# Patient Record
Sex: Female | Born: 1970 | Race: White | Hispanic: No | Marital: Married | Smoking: Never smoker
Health system: Southern US, Community
[De-identification: ages and names within clinical notes are randomized; demographics above are authoritative.]

## PROBLEM LIST (undated history)

## (undated) DIAGNOSIS — E663 Overweight: Secondary | ICD-10-CM

## (undated) DIAGNOSIS — D72829 Elevated white blood cell count, unspecified: Secondary | ICD-10-CM

## (undated) DIAGNOSIS — E782 Mixed hyperlipidemia: Secondary | ICD-10-CM

## (undated) DIAGNOSIS — J029 Acute pharyngitis, unspecified: Secondary | ICD-10-CM

## (undated) DIAGNOSIS — G43909 Migraine, unspecified, not intractable, without status migrainosus: Secondary | ICD-10-CM

## (undated) DIAGNOSIS — F329 Major depressive disorder, single episode, unspecified: Secondary | ICD-10-CM

## (undated) DIAGNOSIS — G44209 Tension-type headache, unspecified, not intractable: Secondary | ICD-10-CM

## (undated) DIAGNOSIS — K219 Gastro-esophageal reflux disease without esophagitis: Secondary | ICD-10-CM

## (undated) DIAGNOSIS — R000 Tachycardia, unspecified: Secondary | ICD-10-CM

## (undated) DIAGNOSIS — G44009 Cluster headache syndrome, unspecified, not intractable: Secondary | ICD-10-CM

## (undated) DIAGNOSIS — Z0000 Encounter for general adult medical examination without abnormal findings: Secondary | ICD-10-CM

## (undated) DIAGNOSIS — L259 Unspecified contact dermatitis, unspecified cause: Secondary | ICD-10-CM

## (undated) HISTORY — DX: Tension-type headache, unspecified, not intractable: G44.209

## (undated) HISTORY — DX: Unspecified contact dermatitis, unspecified cause: L25.9

## (undated) HISTORY — DX: Major depressive disorder, single episode, unspecified: F32.9

## (undated) HISTORY — PX: GASTRIC FUNDOPLICATION: SHX226

## (undated) HISTORY — PX: ARTHROSCOPIC REPAIR ACL: SUR80

## (undated) HISTORY — DX: Tachycardia, unspecified: R00.0

## (undated) HISTORY — DX: Cluster headache syndrome, unspecified, not intractable: G44.009

## (undated) HISTORY — DX: Elevated white blood cell count, unspecified: D72.829

## (undated) HISTORY — DX: Gastro-esophageal reflux disease without esophagitis: K21.9

## (undated) HISTORY — DX: Migraine, unspecified, not intractable, without status migrainosus: G43.909

## (undated) HISTORY — DX: Encounter for general adult medical examination without abnormal findings: Z00.00

## (undated) HISTORY — DX: Acute pharyngitis, unspecified: J02.9

## (undated) HISTORY — PX: OTHER SURGICAL HISTORY: SHX169

## (undated) HISTORY — DX: Overweight: E66.3

## (undated) HISTORY — DX: Mixed hyperlipidemia: E78.2

---
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
## 1999-09-23 HISTORY — PX: LIPOSUCTION: SHX10

## 2003-09-23 HISTORY — PX: OTHER SURGICAL HISTORY: SHX169

## 2008-09-22 HISTORY — PX: OTHER SURGICAL HISTORY: SHX169

## 2009-10-23 LAB — CONVERTED CEMR LAB

## 2010-05-29 ENCOUNTER — Inpatient Hospital Stay (HOSPITAL_COMMUNITY): Admission: AD | Admit: 2010-05-29 | Discharge: 2010-05-30 | Payer: Self-pay | Admitting: Obstetrics and Gynecology

## 2010-09-30 ENCOUNTER — Ambulatory Visit
Admission: RE | Admit: 2010-09-30 | Discharge: 2010-09-30 | Payer: Self-pay | Source: Home / Self Care | Attending: Family Medicine | Admitting: Family Medicine

## 2010-09-30 DIAGNOSIS — G44209 Tension-type headache, unspecified, not intractable: Secondary | ICD-10-CM | POA: Insufficient documentation

## 2010-09-30 DIAGNOSIS — E782 Mixed hyperlipidemia: Secondary | ICD-10-CM

## 2010-09-30 DIAGNOSIS — K219 Gastro-esophageal reflux disease without esophagitis: Secondary | ICD-10-CM | POA: Insufficient documentation

## 2010-09-30 DIAGNOSIS — F329 Major depressive disorder, single episode, unspecified: Secondary | ICD-10-CM | POA: Insufficient documentation

## 2010-09-30 DIAGNOSIS — G43909 Migraine, unspecified, not intractable, without status migrainosus: Secondary | ICD-10-CM | POA: Insufficient documentation

## 2010-09-30 DIAGNOSIS — F3289 Other specified depressive episodes: Secondary | ICD-10-CM | POA: Insufficient documentation

## 2010-09-30 DIAGNOSIS — E663 Overweight: Secondary | ICD-10-CM

## 2010-09-30 HISTORY — DX: Other specified depressive episodes: F32.89

## 2010-09-30 HISTORY — DX: Gastro-esophageal reflux disease without esophagitis: K21.9

## 2010-09-30 HISTORY — DX: Migraine, unspecified, not intractable, without status migrainosus: G43.909

## 2010-09-30 HISTORY — DX: Major depressive disorder, single episode, unspecified: F32.9

## 2010-09-30 HISTORY — DX: Mixed hyperlipidemia: E78.2

## 2010-09-30 HISTORY — DX: Overweight: E66.3

## 2010-09-30 HISTORY — DX: Tension-type headache, unspecified, not intractable: G44.209

## 2010-10-02 ENCOUNTER — Encounter (INDEPENDENT_AMBULATORY_CARE_PROVIDER_SITE_OTHER): Payer: Self-pay | Admitting: *Deleted

## 2010-10-07 ENCOUNTER — Ambulatory Visit (HOSPITAL_COMMUNITY)
Admission: RE | Admit: 2010-10-07 | Discharge: 2010-10-07 | Payer: Self-pay | Source: Home / Self Care | Attending: Obstetrics and Gynecology | Admitting: Obstetrics and Gynecology

## 2010-10-21 ENCOUNTER — Other Ambulatory Visit (HOSPITAL_COMMUNITY): Payer: Self-pay | Admitting: Obstetrics and Gynecology

## 2010-10-21 ENCOUNTER — Ambulatory Visit (HOSPITAL_COMMUNITY): Admission: RE | Admit: 2010-10-21 | Payer: Self-pay | Source: Home / Self Care | Admitting: Obstetrics and Gynecology

## 2010-10-21 DIAGNOSIS — N971 Female infertility of tubal origin: Secondary | ICD-10-CM

## 2010-10-24 ENCOUNTER — Encounter: Payer: Self-pay | Admitting: Family Medicine

## 2010-10-24 NOTE — Letter (Signed)
Summary: New Patient letter  [facility]  [address]   [place], [place] [address]   Phone: [phone]  Fax: [phone]       10/02/2010 MRN: [id number]  [facility] [address] [place], [place]  [address]  Dear Ms. Autumn Jensen,  Welcome to the Gastroenterology Division at [facility].    You are scheduled to see Dr.  Jarold Motto on 11/07/2010 at 9:30am on the 3rd floor at [facility], [address]  We ask that you try to arrive at our office 15 minutes prior to your appointment time to allow for check-in.  We would like you to complete the enclosed self-administered evaluation form prior to your visit and bring it with you on the day of your appointment.  We will review it with you.  Also, please bring a complete list of all your medications or, if you prefer, bring the medication bottles and we will list them.  Please bring your insurance card so that we may make a copy of it.  If your insurance requires a referral to see a specialist, please bring your referral form from your primary care physician.  Co-payments are due at the time of your visit and may be paid by cash, check or credit card.     Your office visit will consist of a consult with your physician (includes a physical exam), any laboratory testing he/she may order, scheduling of any necessary diagnostic testing (e.g. x-ray, ultrasound, CT-scan), and scheduling of a procedure (e.g. Endoscopy, Colonoscopy) if required.  Please allow enough time on your schedule to allow for any/all of these possibilities.    If you cannot keep your appointment, please call [phone] to cancel or reschedule prior to your appointment date.  This allows [place] the opportunity to schedule an appointment for another patient in need of care.  If you do not cancel or reschedule by 5 p.m. the business day prior to your appointment date, you will be charged a $50.00 late cancellation/no-show fee.    Thank you for  choosing [facility] for your medical needs.  We appreciate the opportunity to care for you.  Please visit [place] at our website  to learn more about our practice.                     Sincerely,                                                             The Gastroenterology Division

## 2010-10-24 NOTE — Assessment & Plan Note (Signed)
Summary: New pt BC[employer]/dt   Vital Signs:  Patient profile:   40 year old female Menstrual status:  regular Height:      66.75 inches (169.55 cm) Weight:      175.50 pounds (79.77 kg) BMI:     27.79 O2 Sat:      97 % on Room air Temp:     98.2 degrees F (36.78 degrees C) oral Pulse rate:   86 / minute BP sitting:   145 / 87  (right arm) Cuff size:   regular  Vitals Entered By: Josph Macho RMA (September 30, 2010 9:48 AM)  O2 Flow:  Room air CC: Establish new patient/ CF Is Patient Diabetic? No     Menstrual Status regular Last PAP Result historical   History of Present Illness: Patient is a 40 yo Caucasian female in today for new patient appt. 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 She is generally in good health but is struggling with increased heartburn recently as well as some low grade depression. She is asking about the uses St. John's wort but has not had anything to eat. She is acknowledging she gets poor sleep, has very little time to herself and is struggling with poor memory and concentration. Her mood is low and some mild irritability is noted at times. No suicidal or homicidal ideation. She has started volunteering at [employer] and is able to get out to run errands once a week without the kids as well. This helps some. She is married and her husband has been very supportive. Her other concerns are a history of infrequent Migraine HAs and some pain over her neck/occiput and sinuses with tension/alcohol or caffeine consumption. She does not take any meds for these and they eventually resolve spontaneously. She has a long h/o reflux and actually underwent a Fundoplication which controlled her symptoms until her second pregnancy and she is now back on Prilosec. She has reflux symptoms roughly once a week, does not correlate it with any dietary changes. no recent illness, f, c, CP, palp, SOB, GI or GU c/o.  Preventive Screening-Counseling &  Management  Alcohol-Tobacco     Smoking Status: never  Caffeine-Diet-Exercise     Does Patient Exercise: yes      Drug Use:  no.    Current Medications (verified): 1)  Prilosec 20 Mg Cpdr (Omeprazole) .... Once Daily  Allergies (verified): No Known Drug Allergies  Past History:  Past Medical History: GERD  Past Surgical History: tympanostomy tubes x 1 at age 42yo right knee repair and cartilage repair in 2010 Left Knee repair 1988, arthroscopic Liposuction thighs, 2001 Nissan fundoplication for GERD in 2005  Family History: Father: 87, hyperlipidemia, Prostate Cancer, Reflux Mother: 60, hyperlipidemia Siblings:  Sister: 36, Reflux, congenital heart disease, 3 holes in her heart, brain damage Brother: 40, joint disease s/p hip replacement, gout, cig & alcohol MGM: deceased@73 , brain aneurysm, chronic HA MGF: deceased@92 , old age PGM: deceased@78 , lung cancer, smoker, alcohol PGF: deceased younger car accident, abnormal curvature of spine Daughter: 60yo, A&W Son: 2yo, A&W Son: 4 mn, A&W PUncle: deceased@50 , gastric cancer, smoked and drank PUncle: deceased@42 , Head and Neck Cancer in sinuses  Social History: Occupation: stay at home Mom, previously worked English as a second language teacher and [employer] Married Never Smoked Alcohol use-yes, rare Drug use-no Regular exercise-yes, restarted Seat belt use No dietary restrictions Smoking Status:  never Occupation:  employed Drug Use:  no Does Patient Exercise:  yes  Review of Systems  The patient denies anorexia, fever, weight loss, weight gain, vision loss, decreased hearing, hoarseness, chest pain, syncope, dyspnea on exertion, peripheral edema, prolonged cough, headaches, hemoptysis, abdominal pain, melena, hematochezia, severe indigestion/heartburn, hematuria, incontinence, muscle weakness, suspicious skin lesions, transient blindness, difficulty walking, depression, unusual weight change, abnormal bleeding, and enlarged  lymph nodes.    Physical Exam  General:  Well-developed,well-nourished,in no acute distress; alert,appropriate and cooperative throughout examination Head:  Normocephalic and atraumatic without obvious abnormalities. No apparent alopecia or balding. Eyes:  No corneal or conjunctival inflammation noted. EOMI. Perrla.  Ears:  External ear exam shows no significant lesions or deformities.  Otoscopic examination reveals clear canals, tympanic membranes are intact bilaterally without bulging, retraction, inflammation or discharge. Hearing is grossly normal bilaterally. Nose:  External nasal examination shows no deformity or inflammation. Nasal mucosa are pink and moist without lesions or exudates. Mouth:  Oral mucosa and oropharynx without lesions or exudates.  Teeth in good repair. Neck:  No masses, or tenderness noted. Exaggerated curvature of cervical spine noted. Lungs:  Normal respiratory effort, chest expands symmetrically. Lungs are clear to auscultation, no crackles or wheezes. Heart:  Normal rate and regular rhythm. S1 and S2 normal without gallop, murmur, click, rub or other extra sounds. Abdomen:  Bowel sounds positive,abdomen soft and non-tender without masses, organomegaly or hernias noted. Msk:  No deformity or scoliosis noted of thoracic or lumbar spine.   Pulses:  R and L carotid,radial,femoral,dorsalis pedis and posterior tibial pulses are full and equal bilaterally Extremities:  No clubbing, cyanosis, edema, or deformity noted with normal full range of motion of all joints.   Neurologic:  No cranial nerve deficits noted. Station and gait are normal. Plantar reflexes are down-going bilaterally. DTRs are symmetrical throughout. Sensory, motor and coordinative functions appear intact. Skin:  Intact without suspicious lesions or rashes Cervical Nodes:  No lymphadenopathy noted Psych:  Cognition and judgment appear intact. Alert and cooperative with normal attention span and  concentration. No apparent delusions, illusions, hallucinations   Impression & Recommendations:  Problem # 1:  MIXED HYPERLIPIDEMIA (ICD-272.2) Agrees to FLP at next visit, avoid trans fats, increase exercise and continue fish oil. Consider adding a fiber supplement such as Benefiber or Metamucil daily.  Problem # 2:  GERD (ICD-530.81)  Her updated medication list for this problem includes:    Prilosec 20 Mg Cpdr (Omeprazole) .Marland Kitchen... 1 cap po once daily  Orders: Gastroenterology Referral (GI) Avoid offending foods, may use Tums or Rolaids for  breakthrough symptoms which presently happen roughly once a week.  Problem # 3:  HEADACHE, TENSION (ICD-307.81) Neck has increased curvature, check an xray at next visit. Recommend alternate heat and ice and consider Aspercreme as needed to neck and occiput. Attempt gentle stretching after heat  Problem # 4:  OVERWEIGHT (ICD-278.02) Avoid trans fats, increase exercise and sleep and use complex carbs and lean proteins  Problem # 5:  DEPRESSION (ICD-311) Patient is considering St John's Wort, recommend if she tries this we check a renal and hepatic panel roughly 2 months after starting. Call us if she wants to consider medication.  Complete Medication List: 1)  Prilosec 20 Mg Cpdr (Omeprazole) .Marland Kitchen.. 1 cap po once daily 2)  Omega-3 & Omega-6 Fish Oil Caps (Omega 3-6-9 fatty acids) .... 2 caps daily  Patient Instructions: 1)  Please schedule a follow-up appointment in 2 to 3 months.  2)  Will need fasting labs at or prior to  that visit: V70.0 3)  FLP, hepatic, renal, CBC, TSH 4)  Will check xray of neck at that visit 5)  Consider a Tdap at next visit 6)  Avoid spicy and fatty foods together and use Tums and/or Rolaids as needed for breakthrough heartburn symptoms Prescriptions: PRILOSEC 20 MG CPDR (OMEPRAZOLE) 1 cap po once daily  #90 x 3   Entered and Authorized by:   Danise Edge MD   Signed by:   Danise Edge MD on 09/30/2010   Method used:    Electronically to        [facility]  [address] [phone]* (retail)       [address] [place], [place]  [address]       Ph: [phone] or [phone]       Fax: [phone]   RxID:   [id number]    Orders Added: 1)  Gastroenterology Referral [GI] 2)  New Patient Level IV [[address]644]   Immunization History:  Influenza Immunization History:    Influenza:  historical (05/23/2010)   Immunization History:  Influenza Immunization History:    Influenza:  Historical (05/23/2010)  Preventive Care Screening  Pap Smear:    Date:  10/23/2009    Results:  historical   Last Tetanus Booster:    Date:  09/23/1999    Results:  Historical

## 2010-10-26 ENCOUNTER — Encounter: Payer: Self-pay | Admitting: Obstetrics and Gynecology

## 2010-11-07 ENCOUNTER — Encounter (INDEPENDENT_AMBULATORY_CARE_PROVIDER_SITE_OTHER): Payer: Self-pay | Admitting: *Deleted

## 2010-11-07 ENCOUNTER — Encounter: Payer: Self-pay | Admitting: Gastroenterology

## 2010-11-07 ENCOUNTER — Ambulatory Visit (INDEPENDENT_AMBULATORY_CARE_PROVIDER_SITE_OTHER): Payer: BC Managed Care – PPO | Admitting: Gastroenterology

## 2010-11-07 DIAGNOSIS — K219 Gastro-esophageal reflux disease without esophagitis: Secondary | ICD-10-CM

## 2010-11-13 NOTE — Assessment & Plan Note (Addendum)
Summary: ACID REFLUX  JJ.  SCH'D W/VANESSA @ [facility]//BCBS//POLIC...   History of Present Illness Visit Type: Initial Consult Primary GI MD: Sheryn Bison MD FACP FAGA Primary Provider: Danise Edge, MD Requesting Provider: Danise Edge, MD Chief Complaint: GERD wants to establish GI   last GI  visit was 2005 in [place]  History of Present Illness:   Extremely Pleasant 40 year old white female with 3 young children, youngest 46 months of age. She is referred through the courtesy of Dr. Reuel Derby for evaluation recurrent acid reflux symptoms despite previous fundoplication surgery in 2005 in [place].  Azyriah has had acid reflux for greater than 10 years, underwent fundoplication 2005, did well without symptomatology until 2009 when her symptoms have again recurred with regurgitation and burning substernal chest pain. She's been back on Prilosec taken randomly in the afternoon with fairly good control of her symptoms. She denies dysphagia or painful swallowing, frequent antibiotic use, anorexia, weight loss, lower GI or hepatobiliary complaints. She denies abuse of alcohol, cigarettes, or NSAIDs. Family history is remarkable for several family members with GERD, and her sister has Barrett's mucosa.   GI Review of Systems    Reports acid reflux and  heartburn.      Denies abdominal pain, belching, bloating, chest pain, dysphagia with liquids, dysphagia with solids, loss of appetite, nausea, vomiting, vomiting blood, weight loss, and  weight gain.        Denies anal fissure, black tarry stools, change in bowel habit, constipation, diarrhea, diverticulosis, fecal incontinence, heme positive stool, hemorrhoids, irritable bowel syndrome, jaundice, light color stool, liver problems, rectal bleeding, and  rectal pain.    Current Medications (verified): 1)  Prilosec 20 Mg Cpdr (Omeprazole) .Marland Kitchen.. 1 Cap Po Once Daily 2)  Omega-3 & Omega-6 Fish Oil  Caps (Omega 3-6-9 Fatty Acids) .... 2 Caps  Daily 3)  Multivitamins  Tabs (Multiple Vitamin) .Marland Kitchen.. 1 By Mouth Once Daily  Allergies (verified): No Known Drug Allergies  Past History:  Past medical, surgical, family and social histories (including risk factors) reviewed for relevance to current acute and chronic problems.  Past Medical History: Reviewed history from 09/30/2010 and no changes required. GERD  Past Surgical History: Reviewed history from 09/30/2010 and no changes required. tympanostomy tubes x 1 at age 49yo right knee repair and cartilage repair in 2010 Left Knee repair 1988, arthroscopic Liposuction thighs, 2001 Nissan fundoplication for GERD in 2005  Family History: Reviewed history from 09/30/2010 and no changes required. Father: 28, hyperlipidemia, Prostate Cancer, Reflux Mother: 45, hyperlipidemia Siblings:  Sister: 42, Reflux, congenital heart disease, 3 holes in her heart, brain damage Brother: 4, joint disease s/p hip replacement, gout, cig & alcohol MGM: deceased@73 , brain aneurysm, chronic HA MGF: deceased@92 , old age PGM: deceased@78 , lung cancer, smoker, alcohol PGF: deceased younger car accident, abnormal curvature of spine Daughter: 62yo, A&W Son: 2yo, A&W Son: 4 mn, A&W PUncle: deceased@50 , gastric cancer, smoked and drank PUncle: deceased@42 , Head and Neck Cancer in sinuses  Social History: Reviewed history from 09/30/2010 and no changes required. Occupation: stay at home Mom, previously worked [employer] and [employer] Married Never Smoked Alcohol use-yes, rare Drug use-no Regular exercise-yes, restarted Seat belt use No dietary restrictions  Review of Systems       The patient complains of headaches-new.  The patient denies allergy/sinus, anemia, anxiety-new, arthritis/joint pain, back pain, blood in urine, breast changes/lumps, change in vision, confusion, cough, coughing up blood, depression-new, fainting, fatigue, fever, hearing problems, heart murmur, heart  rhythm  changes, itching, menstrual pain, muscle pains/cramps, night sweats, nosebleeds, pregnancy symptoms, shortness of breath, skin rash, sleeping problems, sore throat, swelling of feet/legs, swollen lymph glands, thirst - excessive , urination - excessive , urination changes/pain, urine leakage, vision changes, and voice change.    Vital Signs:  Patient profile:   40 year old female Menstrual status:  regular Height:      66.75 inches Weight:      175.6 pounds BMI:     27.81 Pulse rate:   80 / minute Pulse rhythm:   regular BP sitting:   126 / 80  (left arm)  Vitals Entered By: Harlow Mares CMA Duncan Dull) (November 07, 2010 9:44 AM)  Physical Exam  General:  Well developed, well nourished, no acute distress.healthy appearing.   Head:  Normocephalic and atraumatic. Eyes:  PERRLA, no icterus.exam deferred to patient's ophthalmologist.   Mouth:  No deformity or lesions, dentition normal. Neck:  Supple; no masses or thyromegaly. Chest Wall:  Symmetrical;  no deformities or tenderness. Lungs:  Clear throughout to auscultation. Heart:  Regular rate and rhythm; no murmurs, rubs,  or bruits. Abdomen:  Soft, nontender and nondistended. No masses, hepatosplenomegaly or hernias noted. Normal bowel sounds. Msk:  Symmetrical with no gross deformities. Normal posture. Extremities:  No clubbing, cyanosis, edema or deformities noted. Neurologic:  Alert and  oriented x4;  grossly normal neurologically. Cervical Nodes:  No significant cervical adenopathy. Psych:  Alert and cooperative. Normal mood and affect.   Impression & Recommendations:  Problem # 1:  GERD (ICD-530.81) Assessment Improved Continue Prilosec 20 mg 30 minutes before the first meal of the day with standard antireflux maneuvers. Endoscopic exam scheduled for assessment of the severity of her problem and to rule out Barrett's mucosa per her family history. Obviously, her fundoplication has slipped, but she does not desire repeat surgery  at this time. Orders: EGD (EGD)  Problem # 2:  OVERWEIGHT (ICD-278.02) Assessment: Improved BMI is 27 and the patient certainly is not obese.  Problem # 3:  DEPRESSION (ICD-311) Assessment: Improved  Patient Instructions: 1)  Copy sent to : Danise Edge, MD 2)  Your procedure has been scheduled for 12/09/2010, please follow the seperate instructions.  3)  [facility] Patient Information Guide given to patient.  4)  Upper Endoscopy brochure given.  5)  Avoid foods high in acid content ( tomatoes, citrus juices, spicy foods) . Avoid eating within 3 to 4 hours of lying down or before exercising. Do not over eat; try smaller more frequent meals. Elevate head of bed four inches when sleeping.  6)  GI Reflux brochure given.  7)  The medication list was reviewed and reconciled.  All changed / newly prescribed medications were explained.  A complete medication list was provided to the patient / caregiver.

## 2010-11-13 NOTE — Letter (Signed)
Summary: EGD Instructions  [facility]  [address] [place], [place] [address]   Phone: [phone]  Fax: [phone]       Autumn Jensen    04-Oct-1970    MRN: [id number]       Procedure Day /Date: Monday 12/09/2010     Arrival Time:  1:00pm     Procedure Time: 2:00pm     Location of Procedure:                    [facility] (4th Floor)   PREPARATION FOR ENDOSCOPY   On 12/09/2010 THE DAY OF THE PROCEDURE:  1.   No solid foods, milk or milk products are allowed after midnight the night before your procedure.  2.   Do not drink anything colored red or purple.  Avoid juices with pulp.  No orange juice.  3.  You may drink clear liquids until 12:00pm, which is 2 hours before your procedure.                                                                                                CLEAR LIQUIDS INCLUDE: Water Jello Ice Popsicles Tea (sugar ok, no milk/cream) Powdered fruit flavored drinks Coffee (sugar ok, no milk/cream) Gatorade Juice: apple, white grape, white cranberry  Lemonade Clear bullion, consomm, broth Carbonated beverages (any kind) Strained chicken noodle soup Hard Candy   MEDICATION INSTRUCTIONS  Unless otherwise instructed, you should take regular prescription medications with a small sip of water as early as possible the morning of your procedure.                OTHER INSTRUCTIONS  You will need a responsible adult at least 39 years of age to accompany you and drive you home.   This person must remain in the waiting room during your procedure.  Wear loose fitting clothing that is easily removed.  Leave jewelry and other valuables at home.  However, you may wish to bring a book to read or an iPod/MP3 player to listen to music as you wait for your procedure to start.  Remove all body piercing jewelry and leave at home.  Total time from sign-in until discharge is approximately 2-3 hours.  You should go  home directly after your procedure and rest.  You can resume normal activities the day after your procedure.  The day of your procedure you should not:   Drive   Make legal decisions   Operate machinery   Drink alcohol   Return to work  You will receive specific instructions about eating, activities and medications before you leave.    The above instructions have been reviewed and explained to me by   _______________________    I fully understand and can verbalize these instructions _____________________________ Date _________

## 2010-11-15 ENCOUNTER — Other Ambulatory Visit (HOSPITAL_COMMUNITY): Payer: BC Managed Care – PPO

## 2010-11-25 ENCOUNTER — Encounter: Payer: Self-pay | Admitting: Family Medicine

## 2010-11-25 ENCOUNTER — Ambulatory Visit (INDEPENDENT_AMBULATORY_CARE_PROVIDER_SITE_OTHER): Payer: BC Managed Care – PPO | Admitting: Family Medicine

## 2010-11-25 DIAGNOSIS — K219 Gastro-esophageal reflux disease without esophagitis: Secondary | ICD-10-CM

## 2010-11-25 DIAGNOSIS — J020 Streptococcal pharyngitis: Secondary | ICD-10-CM

## 2010-11-25 DIAGNOSIS — G44209 Tension-type headache, unspecified, not intractable: Secondary | ICD-10-CM

## 2010-11-25 DIAGNOSIS — E663 Overweight: Secondary | ICD-10-CM

## 2010-11-25 DIAGNOSIS — E782 Mixed hyperlipidemia: Secondary | ICD-10-CM

## 2010-11-25 DIAGNOSIS — G43909 Migraine, unspecified, not intractable, without status migrainosus: Secondary | ICD-10-CM

## 2010-11-25 LAB — CONVERTED CEMR LAB: Rapid Strep: POSITIVE

## 2010-11-26 ENCOUNTER — Other Ambulatory Visit (HOSPITAL_COMMUNITY): Payer: Self-pay | Admitting: Obstetrics and Gynecology

## 2010-11-26 DIAGNOSIS — N971 Female infertility of tubal origin: Secondary | ICD-10-CM

## 2010-11-29 ENCOUNTER — Encounter: Payer: Self-pay | Admitting: Family Medicine

## 2010-12-02 ENCOUNTER — Ambulatory Visit (HOSPITAL_COMMUNITY): Admission: RE | Admit: 2010-12-02 | Payer: BC Managed Care – PPO | Source: Ambulatory Visit

## 2010-12-02 ENCOUNTER — Encounter: Payer: Self-pay | Admitting: Family Medicine

## 2010-12-03 NOTE — Assessment & Plan Note (Signed)
Summary: ? strep throat   Vital Signs:  Patient profile:   40 year old female Menstrual status:  regular Height:      66.75 inches (169.55 cm) Weight:      175 pounds (79.55 kg) O2 Sat:      97 % on Room air Temp:     9.0 degrees F (-12.78 degrees C) oral Pulse rate:   92 / minute BP sitting:   140 / 82  (right arm) Cuff size:   regular  Vitals Entered By: Josph Macho RMA (November 25, 2010 2:36 PM)  O2 Flow:  Room air CC: Possible strep throat, sore throat w/ white spots on back of throat/ CF Is Patient Diabetic? No   History of Present Illness: Patient is a 40 yo Caucasian female in today for evaluation of sore throat. She has a distant history of strep pharyngitis. She is a couple of weeks ago she developed fevers, chills, headache, sore throat. she reports her throat was red, swollen and had red spots. She has a family member who is a physician they called in penicillin t.i.d. for 10 days for her and her symptoms improved markedly but she finished her antibiotic week ago and in the last 2 days her symptoms began to recur. She is presently having malaise, sore and red throat but so far no fevers or chills are returned although she is noting some nausea and mild anorexia. She is having headaches but doesn't an ongoing problem. She does have a history of both tension and migraine headaches. The migraines with the nausea visual changes, photophobia and phonophobia or generally infrequent but have become somewhat more frequent. Of note she has several small children her youngest is 67 months of age she is getting very poor sleep is eating poorly he is not exercising and is under great stress. She noticed this is playing some role. She does not hydrate well often skips meals and generally has trouble taking care of herself at the present time. She continues to struggle with some heartburn in the right malar she does feel the Prilosec helps to a degree. She is denying any chest pain,  palpitations, sour taste in her throat, shortness of breath.  Current Medications (verified): 1)  Prilosec 20 Mg Cpdr (Omeprazole) .Marland Kitchen.. 1 Cap Po Once Daily 2)  Omega-3 & Omega-6 Fish Oil  Caps (Omega 3-6-9 Fatty Acids) .... 2 Caps Daily 3)  Multivitamins  Tabs (Multiple Vitamin) .Marland Kitchen.. 1 By Mouth Once Daily  Allergies (verified): No Known Drug Allergies  Past History:  Past medical history reviewed for relevance to current acute and chronic problems. Social history (including risk factors) reviewed for relevance to current acute and chronic problems.  Past Medical History: Reviewed history from 09/30/2010 and no changes required. GERD  Social History: Reviewed history from 09/30/2010 and no changes required. Occupation: stay at home Mom, previously worked [employer] and [employer] Married Never Smoked Alcohol use-yes, rare Drug use-no Regular exercise-yes, restarted Seat belt use No dietary restrictions  Review of Systems      See HPI  Physical Exam  General:  Well-developed,well-nourished,in no acute distress; alert,appropriate and cooperative throughout examination Head:  Normocephalic and atraumatic without obvious abnormalities. No apparent alopecia or balding. Ears:  External ear exam shows no significant lesions or deformities.  Otoscopic examination reveals clear canals, tympanic membranes are intact bilaterally without bulging, retraction, inflammation or discharge. Hearing is grossly normal bilaterally. Nose:  External nasal examination shows no deformity or inflammation. Nasal  mucosa are pink and moist without lesions or exudates. Mouth:  Oral mucosa without lesions or exudates.  Teeth in good repair.pharyngeal erythema and mild edema, tonsils not particularly hypertrophied Neck:  No deformities, masses, or tenderness noted. Lungs:  Normal respiratory effort, chest expands symmetrically. Lungs are clear to auscultation, no crackles or wheezes. Heart:  Normal  rate and regular rhythm. S1 and S2 normal without gallop, murmur, click, rub or other extra sounds. Abdomen:  Bowel sounds positive,abdomen soft and non-tender without masses, organomegaly or hernias noted. Extremities:  No clubbing, cyanosis, edema, or deformity noted with normal full range of motion of all joints.   Cervical Nodes:  R and L  anterior LN tender and mildly enlarged Psych:  Cognition and judgment appear intact. Alert and cooperative with normal attention span and concentration. No apparent delusions, illusions, hallucinations   Impression & Recommendations:  Problem # 1:  PHARYNGITIS, STREPTOCOCCAL (ICD-034.0)  Her updated medication list for this problem includes:    Cefdinir 300 Mg Caps (Cefdinir) .Marland Kitchen... 1 cap by mouth two times a day x 10 day, recently treated with PCN and got notably better but since off of antibiotics symptoms are returning and rapid strep is positive, will retreat today and patient encouraged to start a probiotic cap and eat a yogurt daily. Call if worsening or persistent symptoms  Problem # 2:  OVERWEIGHT (ICD-278.02) Patient very frustrated regarding her weight and fatigue. She is the mother of several young children, getting very little sleep and unable to take care of herself well at the moment but she is encouraged to try for 7-8 hours of sleep nightly, increase her activity, eat small, frequent meals and avoid simple carbs and trans fats, will repeat lab work to evaluate thyroid as well and anemia  Problem # 3:  MIXED HYPERLIPIDEMIA (ICD-272.2)  Will repeat FLP prior to next visit and avoid trans fats in the interim  Problem # 4:  MIGRAINE HEADACHE (ICD-346.90)  Her updated medication list for this problem includes:    Excedrin Migraine 250-250-65 Mg Tabs (Aspirin-acetaminophen-caffeine) .Marland Kitchen... 1 tab by mouth as needed migraine Encouraged good hydration, no skipping meals or over loading on carbs, avoid any obvious food triggers and attempt regular  exercise and 8 hours of sleep nightly, if no improvement has had good results with Imitrex in the past may consider restarting. May also consider a CCB if frequency of HA remains hi despite changes  Problem # 5:  GERD (ICD-530.81)  Her updated medication list for this problem includes:    Prilosec 20 Mg Cpdr (Omeprazole) .Marland Kitchen... 1 cap po once daily Avoid offending foods and eat small, frequent meals  Complete Medication List: 1)  Prilosec 20 Mg Cpdr (Omeprazole) .Marland Kitchen.. 1 cap po once daily 2)  Omega-3 & Omega-6 Fish Oil Caps (Omega 3-6-9 fatty acids) .... 2 caps daily 3)  Multivitamins Tabs (Multiple vitamin) .Marland Kitchen.. 1 by mouth once daily 4)  Cefdinir 300 Mg Caps (Cefdinir) .Marland Kitchen.. 1 cap by mouth two times a day x 10 day 5)  Excedrin Migraine 250-250-65 Mg Tabs (Aspirin-acetaminophen-caffeine) .Marland Kitchen.. 1 tab by mouth as needed migraine  Other Orders: Rapid Strep (29528)  Patient Instructions: 1)  Please schedule a follow-up appointment in 1 month.  2)  Needs annual labs prior to visit, FLP, liver, renal, TSH, CBC just prior to visit Prescriptions: CEFDINIR 300 MG CAPS (CEFDINIR) 1 cap by mouth two times a day x 10 day  #20 x 0   Entered and Authorized by:  Danise Edge MD   Signed by:   Danise Edge MD on 11/25/2010   Method used:   Electronically to        [facility]  [address] [phone]* (retail)       [address] [place], [place]  [address]       Ph: [phone] or [phone]       Fax: [phone]   RxID:   [id number]    Orders Added: 1)  Rapid Strep [36644] 2)  Est. Patient Level IV [03474]    Laboratory Results    Other Tests  Rapid Strep: positive

## 2010-12-05 LAB — CBC
HCT: 35.6 % — ABNORMAL LOW (ref 36.0–46.0)
HCT: 39.8 % (ref 36.0–46.0)
Hemoglobin: 13.6 g/dL (ref 12.0–15.0)
MCHC: 34.2 g/dL (ref 30.0–36.0)
MCV: 90.5 fL (ref 78.0–100.0)
RDW: 14.8 % (ref 11.5–15.5)
WBC: 19 10*3/uL — ABNORMAL HIGH (ref 4.0–10.5)

## 2010-12-05 LAB — RH IMMUNE GLOB WKUP(>/=20WKS)(NOT WOMEN'S HOSP)

## 2010-12-09 ENCOUNTER — Other Ambulatory Visit: Payer: BC Managed Care – PPO | Admitting: Gastroenterology

## 2010-12-16 ENCOUNTER — Encounter: Payer: Self-pay | Admitting: Gastroenterology

## 2010-12-16 ENCOUNTER — Telehealth: Payer: Self-pay | Admitting: *Deleted

## 2010-12-16 ENCOUNTER — Ambulatory Visit (AMBULATORY_SURGERY_CENTER): Payer: BC Managed Care – PPO | Admitting: Gastroenterology

## 2010-12-16 VITALS — BP 133/83 | HR 71 | Temp 96.7°F | Resp 16 | Ht 67.0 in | Wt 170.0 lb

## 2010-12-16 DIAGNOSIS — R933 Abnormal findings on diagnostic imaging of other parts of digestive tract: Secondary | ICD-10-CM

## 2010-12-16 DIAGNOSIS — R6889 Other general symptoms and signs: Secondary | ICD-10-CM

## 2010-12-16 DIAGNOSIS — K219 Gastro-esophageal reflux disease without esophagitis: Secondary | ICD-10-CM

## 2010-12-16 DIAGNOSIS — K2980 Duodenitis without bleeding: Secondary | ICD-10-CM

## 2010-12-16 DIAGNOSIS — R6881 Early satiety: Secondary | ICD-10-CM

## 2010-12-16 DIAGNOSIS — K3184 Gastroparesis: Secondary | ICD-10-CM

## 2010-12-16 DIAGNOSIS — D1330 Benign neoplasm of unspecified part of small intestine: Secondary | ICD-10-CM

## 2010-12-16 DIAGNOSIS — K2270 Barrett's esophagus without dysplasia: Secondary | ICD-10-CM

## 2010-12-16 MED ORDER — AMBULATORY NON FORMULARY MEDICATION: Status: DC

## 2010-12-16 NOTE — Telephone Encounter (Signed)
Domperidone sent after LEC procedure.

## 2010-12-16 NOTE — Patient Instructions (Signed)
Discharged instructions given and verbalizes understanding. Biopsies taken and a soft diet given.

## 2010-12-17 ENCOUNTER — Other Ambulatory Visit (HOSPITAL_COMMUNITY): Payer: Self-pay | Admitting: Obstetrics and Gynecology

## 2010-12-17 ENCOUNTER — Telehealth: Payer: Self-pay | Admitting: *Deleted

## 2010-12-17 DIAGNOSIS — R6889 Other general symptoms and signs: Secondary | ICD-10-CM

## 2010-12-17 DIAGNOSIS — N971 Female infertility of tubal origin: Secondary | ICD-10-CM

## 2010-12-17 LAB — HELICOBACTER PYLORI SCREEN-BIOPSY: UREASE: NEGATIVE

## 2010-12-17 NOTE — Telephone Encounter (Signed)

## 2010-12-18 ENCOUNTER — Ambulatory Visit (HOSPITAL_COMMUNITY)
Admission: RE | Admit: 2010-12-18 | Discharge: 2010-12-18 | Disposition: A | Discharge status: Home / Self Care | Payer: BC Managed Care – PPO | Source: Ambulatory Visit | Attending: Obstetrics and Gynecology | Admitting: Obstetrics and Gynecology

## 2010-12-18 DIAGNOSIS — Z3049 Encounter for surveillance of other contraceptives: Secondary | ICD-10-CM | POA: Insufficient documentation

## 2010-12-18 DIAGNOSIS — N971 Female infertility of tubal origin: Secondary | ICD-10-CM

## 2010-12-20 ENCOUNTER — Encounter: Payer: Self-pay | Admitting: Gastroenterology

## 2010-12-20 NOTE — Progress Notes (Signed)
Patient already rxed for Domperidone.

## 2010-12-24 NOTE — Procedures (Signed)
Summary: Upper Endoscopy  Patient: [name] Note: All result statuses are Final unless otherwise noted.  Tests: (1) Upper Endoscopy (EGD)   EGD Upper Endoscopy       DONE     [facility]     [address]     [place], [place]  [address]          ENDOSCOPY PROCEDURE REPORT          PATIENT:  Autumn Jensen, Autumn Jensen  MR#:  [id number]     BIRTHDATE:  09-16-1971, 39 yrs. old  GENDER:  female          ENDOSCOPIST:  Vania Rea. Jarold Motto, MD, [facility]     Referred by:          PROCEDURE DATE:  12/16/2010     PROCEDURE:  EGD with biopsy for H. pylori 43329     ASA CLASS:  Class II     INDICATIONS:  GERD, early satiety hx of prior fundoplication for     GERD          MEDICATIONS:   Fentanyl 50 mcg IV, Versed 8 mg IV     TOPICAL ANESTHETIC:  Exactacain Spray          DESCRIPTION OF PROCEDURE:   After the risks benefits and     alternatives of the procedure were thoroughly explained, informed     consent was obtained.  The LB GIF-H180 [id number] endoscope was     introduced through the mouth and advanced to the second portion of     the duodenum, without limitations.  The instrument was slowly     withdrawn as the mucosa was fully examined.     <<PROCEDUREIMAGES>>          There were columnar-type mucosal changes in the distal esophagus,     that could represent Barrett's esophagus. GE JUNCTION BIOPSIED,,,     Duodenitis was found. SI BX,. AND CLO TESTS DONE.  Retained food     was present. LARGE VOLUME OF FOOD.COULD NOT SEE FUNDUS     AREA.PYLORIC CHANNEL WIDELY PATENT.    Retroflexed views revealed     retained contents.    The scope was then withdrawn from the     patient and the procedure completed.          COMPLICATIONS:  None          ENDOSCOPIC IMPRESSION:     1) Barrett's, possible     2) Duodenitis     3) Food, retained     4) Retained contents     1.R/O BARRETT'S MUCOSA     2.SEVERE GASTROPARESIS     3.R/O CELIAC DISEASE,H.PYLORI     4.HX OF PRIOR  FUNDOPLICATION     RECOMMENDATIONS:     1) Await biopsy results     2) Rx CLO if positive     3) continue PPI     CONSIDER DOMPERIDOME TRIAL.          REPEAT EXAM:  No          ______________________________     Vania Rea. Jarold Motto, MD, [facility]          CC:          n.     eSIGNED:   Vania Rea. Patterson at 12/16/2010 11:57 AM          Kizzie Fantasia, [id number]  Note: An exclamation mark (!) indicates a result that was not dispersed into the  flowsheet. Document Creation Date: 12/16/2010 11:57 AM _______________________________________________________________________  (1) Order result status: Final Collection or observation date-time: 12/16/2010 11:47 Requested date-time:  Receipt date-time:  Reported date-time:  Referring Physician:   Ordering Physician: Sheryn Bison [phone]) Specimen Source:  Source: Launa Grill Order Number: [id number] Lab site:

## 2011-01-01 ENCOUNTER — Other Ambulatory Visit (INDEPENDENT_AMBULATORY_CARE_PROVIDER_SITE_OTHER): Payer: BC Managed Care – PPO

## 2011-01-01 DIAGNOSIS — Z0000 Encounter for general adult medical examination without abnormal findings: Secondary | ICD-10-CM

## 2011-01-01 LAB — LIPID PANEL
Cholesterol: 236 mg/dL — ABNORMAL HIGH (ref 0–200)
HDL: 59.4 mg/dL
Total CHOL/HDL Ratio: 4
Triglycerides: 97 mg/dL (ref 0.0–149.0)
VLDL: 19.4 mg/dL (ref 0.0–40.0)

## 2011-01-01 LAB — HEPATIC FUNCTION PANEL
ALT: 17 U/L (ref 0–35)
AST: 18 U/L (ref 0–37)
Albumin: 3.7 g/dL (ref 3.5–5.2)
Alkaline Phosphatase: 62 U/L (ref 39–117)
Bilirubin, Direct: 0.1 mg/dL (ref 0.0–0.3)
Total Bilirubin: 0.9 mg/dL (ref 0.3–1.2)
Total Protein: 6.7 g/dL (ref 6.0–8.3)

## 2011-01-01 LAB — RENAL FUNCTION PANEL
Albumin: 3.7 g/dL (ref 3.5–5.2)
Calcium: 9.1 mg/dL (ref 8.4–10.5)
Creatinine, Ser: 1 mg/dL (ref 0.4–1.2)
Glucose, Bld: 78 mg/dL (ref 70–99)
Sodium: 140 mEq/L (ref 135–145)

## 2011-01-02 LAB — CBC
HCT: 44.8 % (ref 36.0–46.0)
MCHC: 32.8 g/dL (ref 30.0–36.0)
MCV: 88.9 fL (ref 78.0–100.0)
RDW: 12.4 % (ref 11.5–15.5)

## 2011-01-06 ENCOUNTER — Encounter: Payer: Self-pay | Admitting: Family Medicine

## 2011-01-06 ENCOUNTER — Ambulatory Visit (INDEPENDENT_AMBULATORY_CARE_PROVIDER_SITE_OTHER): Payer: BC Managed Care – PPO | Admitting: Family Medicine

## 2011-01-06 DIAGNOSIS — F329 Major depressive disorder, single episode, unspecified: Secondary | ICD-10-CM

## 2011-01-06 DIAGNOSIS — E663 Overweight: Secondary | ICD-10-CM

## 2011-01-06 DIAGNOSIS — G43909 Migraine, unspecified, not intractable, without status migrainosus: Secondary | ICD-10-CM

## 2011-01-06 DIAGNOSIS — E782 Mixed hyperlipidemia: Secondary | ICD-10-CM

## 2011-01-06 DIAGNOSIS — G44209 Tension-type headache, unspecified, not intractable: Secondary | ICD-10-CM

## 2011-01-06 DIAGNOSIS — K219 Gastro-esophageal reflux disease without esophagitis: Secondary | ICD-10-CM

## 2011-01-06 DIAGNOSIS — F3289 Other specified depressive episodes: Secondary | ICD-10-CM

## 2011-01-06 MED ORDER — ATENOLOL 25 MG PO TABS: 25.0000 mg | ORAL_TABLET | Freq: Every day | ORAL | Status: DC

## 2011-01-06 NOTE — Patient Instructions (Signed)
Migraine Headache   A migraine headache is an intense, throbbing pain on one or both sides of your head. The exact cause of a migraine headache is not always known. A migraine may be caused when nerves in the brain become irritated and release chemicals that cause swelling (inflammation) within blood vessels, causing pain. Many migraine sufferers have a family history of migraines. Before you get a migraine you may or may not get an aura. An aura is a group of symptoms that can predict the beginning of a migraine. An aura may include:   Visual changes such as:   Flashing lights.   Seeing bright spots or zig-zag lines.   Tunnel vision.   Feelings of numbness.   Trouble talking.   Muscle weakness.   SYMPTOMS OF A MIGRAINE   A migraine headache has one or more of the following symptoms:   Pain on one or both sides of your head.   Pain that is pulsating or throbbing in nature.   Pain that is severe enough to prevent daily activities.   Pain that is aggravated by any daily physical activity.   Nausea (feeling sick to your stomach), vomiting or both.   Pain with exposure to bright lights, loud noises or activity.   General sensitivity to bright lights or loud noises.   MIGRAINE TRIGGERS   A migraine headache can be triggered by many things. Examples of triggers include:   Alcohol.   Smoking.   Stress.   It may be related to menses (female menstruation).   Aged cheeses.   Foods or drinks that contain nitrates, glutamate, aspartame or tyramine.   Lack of sleep.   Chocolate.   Caffeine.   Hunger.   Medications such as nitroglycerine (used to treat chest pain), birth control pills, estrogen and some blood pressure medications.   DIAGNOSIS   A migraine headache is often diagnosed based on:   Your symptoms.   Physical examination.   A CT scan of your head may be ordered to see if your headaches are caused from other medical conditions.   HOME CARE INSTRUCTIONS   Medications can help prevent migraines if they are recurrent or  should they become recurrent. Your caregiver can help you with a medication or treatment program that will be helpful to you.   If you get a migraine, it may be helpful to lie down in a dark, quiet room.   It may be helpful to keep a headache diary. This may help you find a trend as to what may be triggering your headaches.   SEEK IMMEDIATE MEDICAL CARE IF:   You do not get relief from the medications given to you or you have a recurrence of pain.   You have confusion, personality changes or seizures.   You have headaches that wake you from sleep.   You have an increased frequency in your headaches.   You have a stiff neck.   You have a loss of vision.   You have muscle weakness.   You start losing your balance or have trouble walking.   You feel faint or pass out.   MAKE SURE YOU:   Understand these instructions.   Will watch your condition.   Will get help right away if you are not doing well or get worse.   Document Released: 09/08/2005 Document Re-Released: 07/06/2009   [employer]® Patient Information ©2011 [employer].

## 2011-01-10 ENCOUNTER — Encounter: Payer: Self-pay | Admitting: Family Medicine

## 2011-01-10 NOTE — Progress Notes (Signed)
Autumn Jensen [id number] [birth year] 01/10/2011      Progress Note-Follow Up  Subjective  Chief Complaint  Chief Complaint  Patient presents with  . Follow-up    go over labs   HPI  Patient is a 40 year old Caucasian female in today for followup on multiple medical problems. She reports her throat is significantly improved. She has recently undergone upper endoscopy and GI workup. At that visit she was told she had gastroparesis duodenitis as well as retained contents of the stomach, she reports being asymptomatic on her Omeprazole. She denies any reflux at this time and does not want to take any further medications. She believes she had retained contents because she had a very large meal the night before with meat ribs. Unless she develops further symptoms she would like to just maintain the omeprazole. She denies chest pain, fevers, chills, palpitations, shortness of breath, GI or GU complaints at this time. She continues to have ongoing stress being the moment and children but otherwise feels she is doing well.  She is having 2-3 tension HA weekly often starting at the occiput and responding to OTC meds and rest, has infrequent less than monthly migraines.  Past Medical History  Diagnosis Date  . GERD (gastroesophageal reflux disease)   . Headaches, cluster   . Overweight 09/30/2010  . Mixed hyperlipidemia 09/30/2010  . MIGRAINE HEADACHE 09/30/2010  . HEADACHE, TENSION 09/30/2010  . GERD 09/30/2010  . DEPRESSION 09/30/2010    Past Surgical History  Procedure Date  . Typanostomy tubes 40 yr old  . Right knee repair and cartilage repair 2010  . Left knee repair 1988    arthroscopic  . Liposuction 2001    thighs  . Nissan fundoplication for gerd 2005  . Arthroscopic repair acl     also had rt knee athroplasty with cartlidge removal  . Gastric fundoplication     Family History  Problem Relation Age of Onset  . Hyperlipidemia Mother   . Cancer Father     prostate  . Hyperlipidemia  Father   . Heart disease Sister     congenital  . Cancer Paternal Uncle     gastric  . Esophageal cancer Paternal Uncle   . Migraines Maternal Grandmother     chronic headaches  . Aneurysm Maternal Grandmother     brain  . Cancer Paternal Grandmother     lung  . Cancer Paternal Uncle     head and neck cancer in sinuses    History   Social History  . Marital Status: Married    Spouse Name: N/A    Number of Children: N/A  . Years of Education: N/A   Occupational History  . Not on file.   Social History Main Topics  . Smoking status: Never Smoker   . Smokeless tobacco: Never Used  . Alcohol Use: 0.6 oz/week    1 Glasses of wine, 0 Drinks containing 0.5 oz of alcohol per week     rare  . Drug Use: No  . Sexually Active: Yes   Other Topics Concern  . Not on file   Social History Narrative  . No narrative on file    Current Outpatient Prescriptions on File Prior to Visit  Medication Sig Dispense Refill  . aspirin-acetaminophen-caffeine (EXCEDRIN MIGRAINE) 250-250-65 MG per tablet Take 1 tablet by mouth as needed.        . multivitamin (THERAGRAN) per tablet Take 1 tablet by mouth daily.       Marland Kitchen  Omega 3-6-9 Fatty Acids (OMEGA-3 & OMEGA-6 FISH OIL PO) Take by mouth daily. 2 capsules       . omeprazole (PRILOSEC) 20 MG capsule Take 20 mg by mouth daily.        . AMBULATORY NON FORMULARY MEDICATION Domperidone 10mg   Take one tablet before meals and at bedtime.  240 tablet  3  . DISCONTD: cefdinir (OMNICEF) 300 MG capsule Take 300 mg by mouth 2 (two) times daily. X 10 days         No Known Allergies  Review of Systems  Review of Systems  Constitutional: Negative for fever and malaise/fatigue.  HENT: Negative for congestion and sore throat.   Eyes: Negative for discharge.  Respiratory: Negative for cough, shortness of breath and wheezing.   Cardiovascular: Negative for chest pain, palpitations and leg swelling.  Gastrointestinal: Negative for nausea, vomiting,  abdominal pain, diarrhea, blood in stool and melena.  Genitourinary: Positive for frequency. Negative for dysuria.  Musculoskeletal: Positive for myalgias. Negative for falls.  Skin: Negative for rash.  Neurological: Negative for loss of consciousness and headaches.  Endo/Heme/Allergies: Negative for polydipsia.  Psychiatric/Behavioral: Negative for depression and suicidal ideas. The patient is not nervous/anxious and does not have insomnia.     Objective  BP 132/82  Pulse 80  Temp(Src) 97.9 F (36.6 C) (Oral)  Ht 5\' 7"  (1.702 m)  Wt 171 lb 6.4 oz (77.747 kg)  BMI 26.85 kg/m2  SpO2 98%  LMP 12/09/2010  Physical Exam  Physical Exam  Constitutional: She is oriented to person, place, and time and well-developed, well-nourished, and in no distress. No distress.  HENT:  Head: Normocephalic and atraumatic.  Eyes: Conjunctivae are normal.  Neck: Neck supple. No thyromegaly present.  Cardiovascular: Normal rate, regular rhythm and normal heart sounds.   No murmur heard. Pulmonary/Chest: Effort normal and breath sounds normal. She has no wheezes.  Abdominal: She exhibits no distension and no mass.  Musculoskeletal: She exhibits no edema.  Lymphadenopathy:    She has no cervical adenopathy.  Neurological: She is alert and oriented to person, place, and time.  Skin: Skin is warm and dry. No rash noted. She is not diaphoretic.  Psychiatric: Memory, affect and judgment normal.    Lab Results  Component Value Date   TSH 1.33 01/01/2011   Lab Results  Component Value Date   WBC 8.2 01/01/2011   HGB 14.7 01/01/2011   HCT 44.8 01/01/2011   MCV 88.9 01/01/2011   PLT 294 01/01/2011   Lab Results  Component Value Date   CREATININE 1.0 01/01/2011   BUN 16 01/01/2011   NA 140 01/01/2011   K 4.1 01/01/2011   CL 104 01/01/2011   CO2 29 01/01/2011   Lab Results  Component Value Date   ALT 17 01/01/2011   AST 18 01/01/2011   ALKPHOS 62 01/01/2011   BILITOT 0.9 01/01/2011   Lab Results    Component Value Date   CHOL 236* 01/01/2011   Lab Results  Component Value Date   HDL 59.40 01/01/2011   No results found for this basename: LDLCALC   Lab Results  Component Value Date   TRIG 97.0 01/01/2011   Lab Results  Component Value Date   CHOLHDL 4 01/01/2011     Assessment & Plan  OVERWEIGHT Patient is a very busy young mother but she is encouraged to try and get as much exercise as possible, avoid trans fats and simple carbs and minimize po intake  Mixed hyperlipidemia Recent  labs reviewed with patient. She is encouraged to avoid trans fats and simple carbs. Start fish oil and fiber supplements and increase exercise, reassess in 6-12 months  MIGRAINE HEADACHE Infrequent migraines at present. Reminded to maintain 8 hr of sleep, small frequent meals with lean proteins and complex carbs, avoid offending foods and notify us if symptoms worsen  HEADACHE, TENSION Patient notes these are more frequent than her migraines and can occur several times a week, usually OTC meds and rest resolve them. They tend to start in the occiput but do not have to, Likely a stress component and consider mild dehydration. Encouraged 64 oz clear fluids, regular sleep and exercise, try moist heat and gentle stretching to neck.   GERD Recently underwent upper endoscopy and GI evaluation and was found to have gastroparesis, slow gastric emptying and duodenitis. She feels some of this is related to the large meal of ribs she ate the night before and she is asymptomatic at this time on the Omeprazole. She has chosen to cont the Omeprazole but not to proceed with further meds. She is encouraged to eat small, frequent meals with small portions of fats and proteins and not to eat too close to bedtime. Consider further treatments if symptoms worsen  DEPRESSION At present she feels she is doing well and she is encouraged to try and carve out a little time for herself. She will notify us if she needs further  assistance

## 2011-01-10 NOTE — Assessment & Plan Note (Signed)
Patient notes these are more frequent than her migraines and can occur several times a week, usually OTC meds and rest resolve them. They tend to start in the occiput but do not have to, Likely a stress component and consider mild dehydration. Encouraged 64 oz clear fluids, regular sleep and exercise, try moist heat and gentle stretching to neck.

## 2011-01-10 NOTE — Assessment & Plan Note (Signed)
Recent labs reviewed with patient. She is encouraged to avoid trans fats and simple carbs. Start fish oil and fiber supplements and increase exercise, reassess in 6-12 months

## 2011-01-10 NOTE — Assessment & Plan Note (Signed)
Recently underwent upper endoscopy and GI evaluation and was found to have gastroparesis, slow gastric emptying and duodenitis. She feels some of this is related to the large meal of ribs she ate the night before and she is asymptomatic at this time on the Omeprazole. She has chosen to cont the Omeprazole but not to proceed with further meds. She is encouraged to eat small, frequent meals with small portions of fats and proteins and not to eat too close to bedtime. Consider further treatments if symptoms worsen

## 2011-01-10 NOTE — Assessment & Plan Note (Signed)
Infrequent migraines at present. Reminded to maintain 8 hr of sleep, small frequent meals with lean proteins and complex carbs, avoid offending foods and notify us if symptoms worsen

## 2011-01-10 NOTE — Assessment & Plan Note (Signed)
At present she feels she is doing well and she is encouraged to try and carve out a little time for herself. She will notify us if she needs further assistance

## 2011-01-10 NOTE — Assessment & Plan Note (Signed)
Patient is a very busy young mother but she is encouraged to try and get as much exercise as possible, avoid trans fats and simple carbs and minimize po intake

## 2011-02-11 ENCOUNTER — Encounter: Payer: Self-pay | Admitting: Family Medicine

## 2011-02-11 ENCOUNTER — Ambulatory Visit (INDEPENDENT_AMBULATORY_CARE_PROVIDER_SITE_OTHER): Payer: BC Managed Care – PPO | Admitting: Family Medicine

## 2011-02-11 VITALS — BP 144/86 | HR 94 | Temp 99.6°F | Ht 66.75 in | Wt 166.1 lb

## 2011-02-11 DIAGNOSIS — J029 Acute pharyngitis, unspecified: Secondary | ICD-10-CM

## 2011-02-11 DIAGNOSIS — L309 Dermatitis, unspecified: Secondary | ICD-10-CM

## 2011-02-11 DIAGNOSIS — R000 Tachycardia, unspecified: Secondary | ICD-10-CM

## 2011-02-11 DIAGNOSIS — L259 Unspecified contact dermatitis, unspecified cause: Secondary | ICD-10-CM

## 2011-02-11 HISTORY — DX: Tachycardia, unspecified: R00.0

## 2011-02-11 HISTORY — DX: Acute pharyngitis, unspecified: J02.9

## 2011-02-11 HISTORY — DX: Unspecified contact dermatitis, unspecified cause: L25.9

## 2011-02-11 MED ORDER — AMOXICILLIN-POT CLAVULANATE 875-125 MG PO TABS: 1.0000 | ORAL_TABLET | Freq: Two times a day (BID) | ORAL | Status: AC

## 2011-02-11 MED ORDER — TRIAMCINOLONE ACETONIDE 0.1 % EX OINT: TOPICAL_OINTMENT | Freq: Two times a day (BID) | CUTANEOUS | Status: AC

## 2011-02-11 NOTE — Assessment & Plan Note (Signed)
The patient is in today with 5 days of worsening sore throat. Has a long history of both staph and strep. Rapid strep is positive today in the office. Will treat with Augmentin 875 and she is asked to eat a yogurt and take a probiotic cap such as Align daily for the next couple of months to try and decrease number of infections moving forward

## 2011-02-11 NOTE — Assessment & Plan Note (Signed)
In today with two young children and not feeling well. Improved on recheck will continue to monitor

## 2011-02-11 NOTE — Patient Instructions (Signed)
Contact Dermatitis Contact dermatitis is a reaction to certain substances that touch the skin. The substances may irritate the skin or cause an allergic reaction. Many substances can cause contact dermatitis. Common causes are cosmetics, jewelry, soaps, solvents and any number of chemicals. The area of skin that is exposed becomes dry, red, cracked, and itchy, and looks like a rash. In severe cases, blisters may develop. Symptoms (problems) can be controlled with treatment and by avoiding substances that caused the reaction. TREATMENT AND PREVENTION MEASURES  Keep the area of skin that is affected away from hot water, soap, sunlight, chemicals, acidic substances, or anything else that would irritate your skin. Do not rub the skin.   Use medications as directed.   You may use:   A topical steroid to reduce inflammation (redness or soreness) and anti-bacterial (germ) ointments for secondary infections.   Lubricants to keep moisture in your skin.   Burrow's solution to reduce inflammation or dry rash if weeping. Mix one packet or tablet in 2 cups cool water. Dip a clean washcloth in the mixture, wring it out a bit, and put it on the affected area. Leave in place for 30 minutes, then re-soak the washcloth and apply again. Do this as often as possible throughout the day.   If the area is too large to cover with a wash cloth, take several cornstarch or baking soda baths daily.   You may want to rest affected areas until less sore.  SEEK IMMEDIATE MEDICAL CARE IF:  An oral temperature above 104 develops.   You see signs of infection, such as swelling, tenderness, inflammation (redness or soreness), or warmth of affected area.   Treatment does not relieve your symptoms within 2 days, or as suggested by your caregiver.   You have any problems related to medication used.  Document Released: 09/05/2000 Document Re-Released: 02/26/2010 [facility] Patient Information 2011 [place], [place].Pharyngitis  (Viral and Bacterial) Pharyngitis is soreness (inflammation) or infection of the pharynx. It is also called a sore throat. CAUSES Most sore throats are caused by viruses and are part of a cold. However, some sore throats are caused by strep and other bacteria. Sore throats can also be caused by post nasal drip from draining sinuses, allergies and sometimes from sleeping with an open mouth. Infectious sore throats can be spread from person to person by coughing, sneezing and sharing cups or eating utensils. TREATMENT Sore throats that are viral usually last 3-4 days. Viral illness will get better without medications (antibiotics). Strep throat and other bacterial infections will usually begin to get better about 24-48 hours after you begin to take antibiotics. HOME CARE INSTRUCTIONS  If the caregiver feels there is a bacterial infection or if there is a positive strep test, they will prescribe an antibiotic. The full course of antibiotics must be taken!! If the full course of antibiotic is not taken, you or your child may become ill again. If you or your child has strep throat and do not finish all of the medication, serious heart or kidney diseases may develop.   Drink enough water and fluids to keep your urine clear or pale yellow.   Only take over-the-counter or prescription medicines for pain, discomfort or fever as directed by your caregiver.   Get lots of rest.   Gargle with salt water ( tsp. of salt in a glass of water) as often as every 1-2 hours as you need for comfort.   Hard candies may soothe the throat if individual is  not at risk for choking. Throat sprays or lozenges may also be used.  SEEK MEDICAL CARE IF:  Large, tender lumps in the neck develop.   A rash develops.   Green, yellow-brown or bloody sputum is coughed up.   You or your child has an oral temperature above 102 F (38.9 C).   Your baby is older than 3 months with a rectal temperature of 100.5 F (38.1 C) or  higher for more than 1 day.  SEEK IMMEDIATE MEDICAL CARE IF:  A stiff neck develops.   You or your child are drooling or unable to swallow liquids.   You or your child are vomiting, unable to keep medications or liquids down.   You or your child has severe pain, unrelieved with recommended medications.   You or your child are having difficulty breathing (not due to stuffy nose).   You or your child are unable to fully open your mouth.   You or your child develop redness, swelling, or severe pain anywhere on the neck.   You or your child has an oral temperature above 102 F (38.9 C), not controlled by medicine.   Your baby is older than 3 months with a rectal temperature of 102 F (38.9 C) or higher.   Your baby is 35 months old or younger with a rectal temperature of 100.4 F (38 C) or higher.  MAKE SURE YOU:   Understand these instructions.   Will watch your condition.   Will get help right away if you are not doing well or get worse.  Document Released: 09/08/2005 Document Re-Released: 02/26/2010 [facility] Patient Information 2011 [place], [place].

## 2011-02-11 NOTE — Progress Notes (Signed)
Autumn Jensen [id number] [birth year] 02/11/2011      Progress Note-Follow Up  Subjective  Chief Complaint  Chief Complaint  Patient presents with  . Sore Throat    possible strep X 2 days- throat burned last week    HPI  Patient is a 40 year old Caucasian female who is in today with 5 days of worsening sore throat. She describes initially a sensation of burning which has now progressed to a feeling as if her throat is swelling. Today she began with some low-grade malaise and myalgias as well. She denies headache, congestion, cough and any obvious fevers or chills. She denies any GI symptoms such as anorexia or nausea. She denies chest pain, palpitations, shortness of breath. 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 She is here today with her 2 young children and is planning to leave on a camping trip in a few days time. She has not tried any over-the-counter medications for these symptoms thus far. She is also complaining of a rash on her right upper extremity which is been present a couple of days, is linear. This got some mild pruritus and she does believe that poison ivy although she is unclear where she got  Past Medical History  Diagnosis Date  . GERD (gastroesophageal reflux disease)   . Headaches, cluster   . Overweight 09/30/2010  . Mixed hyperlipidemia 09/30/2010  . MIGRAINE HEADACHE 09/30/2010  . HEADACHE, TENSION 09/30/2010  . GERD 09/30/2010  . DEPRESSION 09/30/2010  . Contact dermatitis 02/11/2011  . Pharyngitis, acute 02/11/2011  . Tachycardia 02/11/2011    Past Surgical History  Procedure Date  . Typanostomy tubes 40 yr old  . Right knee repair and cartilage repair 2010  . Left knee repair 1988    arthroscopic  . Liposuction 2001    thighs  . Nissan fundoplication for gerd 2005  . Arthroscopic repair acl     also had rt knee athroplasty with cartlidge removal  . Gastric  fundoplication     Family History  Problem Relation Age of Onset  . Hyperlipidemia Mother   . Cancer Father     prostate  . Hyperlipidemia Father   . Heart disease Sister     congenital  . Cancer Paternal Uncle     gastric  . Esophageal cancer Paternal Uncle   . Migraines Maternal Grandmother     chronic headaches  . Aneurysm Maternal Grandmother     brain  . Cancer Paternal Grandmother     lung  . Cancer Paternal Uncle     head and neck cancer in sinuses    History   Social History  . Marital Status: Married    Spouse Name: N/A    Number of Children: N/A  . Years of Education: N/A   Occupational History  . Not on file.   Social History Main Topics  . Smoking status: Never Smoker   . Smokeless tobacco: Never Used  . Alcohol Use: 0.6 oz/week    1 Glasses of wine, 0 Drinks containing 0.5 oz of alcohol per week     rare  . Drug Use: No  . Sexually Active: Yes   Other Topics Concern  . Not on file   Social History Narrative  . No narrative on file    Current Outpatient Prescriptions on File Prior to Visit  Medication Sig Dispense Refill  . aspirin-acetaminophen-caffeine (EXCEDRIN MIGRAINE) [phone]  MG per tablet Take 1 tablet by mouth as needed.        . multivitamin (THERAGRAN) per tablet Take 1 tablet by mouth daily.       . Omega 3-6-9 Fatty Acids (OMEGA-3 & OMEGA-6 FISH OIL PO) Take by mouth daily. 2 capsules       . omeprazole (PRILOSEC) 20 MG capsule Take 20 mg by mouth daily.        . AMBULATORY NON FORMULARY MEDICATION Domperidone 10mg   Take one tablet before meals and at bedtime.  240 tablet  3  . atenolol (TENORMIN) 25 MG tablet Take 1 tablet (25 mg total) by mouth daily.  30 tablet  3  . Norethin-Eth Estradiol-Fe 0.4-35 MG-MCG CHEW         No Known Allergies  Review of Systems  Review of Systems  Constitutional: Positive for malaise/fatigue. Negative for fever.  HENT: Positive for sore throat. Negative for congestion.   Eyes: Negative  for discharge.  Respiratory: Negative for shortness of breath.   Cardiovascular: Negative for chest pain, palpitations and leg swelling.  Gastrointestinal: Negative for nausea, abdominal pain and diarrhea.  Genitourinary: Negative for dysuria.  Musculoskeletal: Positive for myalgias. Negative for falls.  Skin: Negative for rash.  Neurological: Negative for loss of consciousness and headaches.  Endo/Heme/Allergies: Negative for polydipsia.  Psychiatric/Behavioral: Negative for depression and suicidal ideas. The patient is not nervous/anxious and does not have insomnia.     Objective  BP 144/86  Pulse 94  Temp(Src) 99.6 F (37.6 C) (Oral)  Ht 5' 6.75" (1.695 m)  Wt 166 lb 1.9 oz (75.352 kg)  BMI 26.21 kg/m2  SpO2 96%  LMP 02/10/2011  Physical Exam  Physical Exam  Constitutional: She is oriented to person, place, and time and well-developed, well-nourished, and in no distress. No distress.  HENT:  Head: Normocephalic and atraumatic.  Nose: Nose normal.  Mouth/Throat: No oropharyngeal exudate.       Erythema in throat  Eyes: Conjunctivae are normal.  Neck: Neck supple. No thyromegaly present.  Cardiovascular: Normal rate, regular rhythm and normal heart sounds.   No murmur heard. Pulmonary/Chest: Effort normal and breath sounds normal. She has no wheezes.  Abdominal: She exhibits no distension and no mass.  Musculoskeletal: She exhibits no edema.  Lymphadenopathy:    She has no cervical adenopathy.  Neurological: She is alert and oriented to person, place, and time.  Skin: Skin is warm and dry. No rash noted. She is not diaphoretic.  Psychiatric: Memory, affect and judgment normal.    Lab Results  Component Value Date   TSH 1.33 01/01/2011   Lab Results  Component Value Date   WBC 8.2 01/01/2011   HGB 14.7 01/01/2011   HCT 44.8 01/01/2011   MCV 88.9 01/01/2011   PLT 294 01/01/2011   Lab Results  Component Value Date   CREATININE 1.0 01/01/2011   BUN 16 01/01/2011    NA 140 01/01/2011   K 4.1 01/01/2011   CL 104 01/01/2011   CO2 29 01/01/2011   Lab Results  Component Value Date   ALT 17 01/01/2011   AST 18 01/01/2011   ALKPHOS 62 01/01/2011   BILITOT 0.9 01/01/2011   Lab Results  Component Value Date   CHOL 236* 01/01/2011   Lab Results  Component Value Date   HDL 59.40 01/01/2011   No results found for this basename: [facility]   Lab Results  Component Value Date   TRIG 97.0 01/01/2011   Lab Results  Component Value Date   CHOLHDL 4 01/01/2011     Assessment & Plan  Pharyngitis, acute The patient is in today with 5 days of worsening sore throat. Has a long history of both staph and strep. Rapid strep is positive today in the office. Will treat with Augmentin 875 and she is asked to eat a yogurt and take a probiotic cap such as Align daily for the next couple of months to try and decrease number of infections moving forward  Contact dermatitis The patient has a linear lesion on her right upper extremity which is mildly pruritic raised. Consistent with poison ivy she's had in the past. We will give her some triamcinolone ointment 0.1% to use topically twice a day as needed and she will report if symptoms worsen. Encouraged to use witch hazel astringent when necessary for any new lesions.  Tachycardia In today with two young children and not feeling well. Improved on recheck will continue to monitor

## 2011-02-11 NOTE — Assessment & Plan Note (Signed)
The patient has a linear lesion on her right upper extremity which is mildly pruritic raised. Consistent with poison ivy she's had in the past. We will give her some triamcinolone ointment 0.1% to use topically twice a day as needed and she will report if symptoms worsen. Encouraged to use witch hazel astringent when necessary for any new lesions.

## 2011-03-05 ENCOUNTER — Ambulatory Visit: Payer: BC Managed Care – PPO | Admitting: Family Medicine

## 2011-11-21 ENCOUNTER — Encounter: Payer: Self-pay | Admitting: Family Medicine

## 2011-11-21 ENCOUNTER — Ambulatory Visit (INDEPENDENT_AMBULATORY_CARE_PROVIDER_SITE_OTHER): Payer: PRIVATE HEALTH INSURANCE | Admitting: Family Medicine

## 2011-11-21 VITALS — BP 138/84 | HR 87 | Temp 97.8°F | Resp 12 | Ht 67.0 in | Wt 163.0 lb

## 2011-11-21 DIAGNOSIS — E785 Hyperlipidemia, unspecified: Secondary | ICD-10-CM

## 2011-11-21 DIAGNOSIS — IMO0001 Reserved for inherently not codable concepts without codable children: Secondary | ICD-10-CM

## 2011-11-21 DIAGNOSIS — R5383 Other fatigue: Secondary | ICD-10-CM

## 2011-11-21 DIAGNOSIS — K219 Gastro-esophageal reflux disease without esophagitis: Secondary | ICD-10-CM

## 2011-11-21 DIAGNOSIS — R5381 Other malaise: Secondary | ICD-10-CM

## 2011-11-21 DIAGNOSIS — F329 Major depressive disorder, single episode, unspecified: Secondary | ICD-10-CM

## 2011-11-21 DIAGNOSIS — Z23 Encounter for immunization: Secondary | ICD-10-CM

## 2011-11-21 DIAGNOSIS — Z0000 Encounter for general adult medical examination without abnormal findings: Secondary | ICD-10-CM

## 2011-11-21 DIAGNOSIS — E782 Mixed hyperlipidemia: Secondary | ICD-10-CM

## 2011-11-21 DIAGNOSIS — E663 Overweight: Secondary | ICD-10-CM

## 2011-11-21 DIAGNOSIS — F3289 Other specified depressive episodes: Secondary | ICD-10-CM

## 2011-11-21 DIAGNOSIS — R000 Tachycardia, unspecified: Secondary | ICD-10-CM

## 2011-11-21 LAB — CBC
HCT: 43.7 % (ref 36.0–46.0)
Hemoglobin: 14.6 g/dL (ref 12.0–15.0)
MCHC: 33.3 g/dL (ref 30.0–36.0)
MCV: 88.2 fl (ref 78.0–100.0)
RDW: 12.8 % (ref 11.5–14.6)

## 2011-11-21 LAB — RENAL FUNCTION PANEL
Albumin: 4.2 g/dL (ref 3.5–5.2)
BUN: 16 mg/dL (ref 6–23)
Calcium: 9.2 mg/dL (ref 8.4–10.5)
Creatinine, Ser: 1 mg/dL (ref 0.4–1.2)
Glucose, Bld: 90 mg/dL (ref 70–99)

## 2011-11-21 LAB — LIPID PANEL
Cholesterol: 184 mg/dL (ref 0–200)
HDL: 55.5 mg/dL (ref >39.00)
VLDL: 11.2 mg/dL (ref 0.0–40.0)

## 2011-11-21 LAB — HEPATIC FUNCTION PANEL
ALT: 16 U/L (ref 0–35)
Total Protein: 7.3 g/dL (ref 6.0–8.3)

## 2011-11-21 LAB — TSH: TSH: 2.09 u[IU]/mL (ref 0.35–5.50)

## 2011-11-21 MED ORDER — OMEPRAZOLE 20 MG PO CPDR: 20.0000 mg | DELAYED_RELEASE_CAPSULE | Freq: Every day | ORAL | Status: DC

## 2011-11-21 NOTE — Patient Instructions (Signed)

## 2011-11-23 ENCOUNTER — Encounter: Payer: Self-pay | Admitting: Family Medicine

## 2011-11-23 DIAGNOSIS — Z0000 Encounter for general adult medical examination without abnormal findings: Secondary | ICD-10-CM

## 2011-11-23 HISTORY — DX: Encounter for general adult medical examination without abnormal findings: Z00.00

## 2011-11-23 NOTE — Progress Notes (Signed)
Patient ID: Autumn HACKWORTH, female   DOB: [birth year], 41 y.o.   MRN: [id number] Autumn Jensen [id number] Jan 04, 1971 11/23/2011      Progress Note New Patient  Subjective  Chief Complaint  Chief Complaint  Patient presents with  . Annual Exam    CPX with fasting labs, no pap    HPI  Patient is a 41 year old Caucasian female who is in today for annual exam. Overall she's doing well. She follows with GYN for her Pap smears. No recent illness, fevers, chills, chest pain, palpitations, shortness of breath, GI or GU complaints recently. She does have to take omeprazole daily for her reflux returns. She done any recent migraines. She is cleared up she is also to get them with poor sleep and alcohol consumption so she tries to avoid both these things. She also notes that occasionally occur premenstrually. She denies any chest palpitations, shortness of breath. She's had no skin concerns or allergic symptoms  Past Medical History  Diagnosis Date  . GERD (gastroesophageal reflux disease)   . Headaches, cluster   . Overweight 09/30/2010  . Mixed hyperlipidemia 09/30/2010  . MIGRAINE HEADACHE 09/30/2010  . HEADACHE, TENSION 09/30/2010  . GERD 09/30/2010  . DEPRESSION 09/30/2010  . Contact dermatitis 02/11/2011  . Pharyngitis, acute 02/11/2011  . Tachycardia 02/11/2011  . Preventative health care 11/23/2011    Past Surgical History  Procedure Date  . Typanostomy tubes 41 yr old  . Right knee repair and cartilage repair 2010  . Left knee repair 1988    arthroscopic  . Liposuction 2001    thighs  . [employer] fundoplication for gerd 2005  . Arthroscopic repair acl     also had rt knee athroplasty with cartlidge removal  . Gastric fundoplication     Family History  Problem Relation Age of Onset  . Hyperlipidemia Mother   . Cancer Father     prostate  . Hyperlipidemia Father   . Heart disease Sister     congenital  . Cancer Paternal Uncle     gastric  . Esophageal cancer Paternal Uncle   .  Migraines Maternal Grandmother     chronic headaches  . Aneurysm Maternal Grandmother     brain  . Cancer Paternal Grandmother     lung  . Cancer Paternal Uncle     head and neck cancer in sinuses    History   Social History  . Marital Status: Married    Spouse Name: N/A    Number of Children: N/A  . Years of Education: N/A   Occupational History  . Not on file.   Social History Main Topics  . Smoking status: Never Smoker   . Smokeless tobacco: Never Used  . Alcohol Use: 0.6 oz/week    1 Glasses of wine, 0 Drinks containing 0.5 oz of alcohol per week     rare  . Drug Use: No  . Sexually Active: Yes   Other Topics Concern  . Not on file   Social History Narrative  . No narrative on file    Current Outpatient Prescriptions on File Prior to Visit  Medication Sig Dispense Refill  . multivitamin (THERAGRAN) per tablet Take 1 tablet by mouth daily.       . Omega 3-6-9 Fatty Acids (OMEGA-3 & OMEGA-6 FISH OIL PO) Take by mouth daily. 2 capsules       . AMBULATORY NON FORMULARY MEDICATION Domperidone 10mg   Take one tablet before meals and at bedtime.  240  tablet  3  . Norethin-Eth Estradiol-Fe 0.4-35 MG-MCG CHEW       . triamcinolone (KENALOG) 0.1 % ointment Apply topically 2 (two) times daily.  30 g  1    No Known Allergies  Review of Systems  Review of Systems  Constitutional: Negative for fever, chills and malaise/fatigue.  HENT: Negative for hearing loss, nosebleeds and congestion.   Eyes: Negative for discharge.  Respiratory: Negative for cough, sputum production, shortness of breath and wheezing.   Cardiovascular: Negative for chest pain, palpitations and leg swelling.  Gastrointestinal: Negative for heartburn, nausea, vomiting, abdominal pain, diarrhea, constipation and blood in stool.  Genitourinary: Negative for dysuria, urgency, frequency and hematuria.  Musculoskeletal: Negative for myalgias, back pain and falls.  Skin: Negative for rash.  Neurological:  Negative for dizziness, tremors, sensory change, focal weakness, loss of consciousness, weakness and headaches.  Endo/Heme/Allergies: Negative for polydipsia. Does not bruise/bleed easily.  Psychiatric/Behavioral: Negative for depression and suicidal ideas. The patient is not nervous/anxious and does not have insomnia.     Objective  BP 138/84  Pulse 87  Temp(Src) 97.8 F (36.6 C) (Oral)  Resp 12  Ht 5\' 7"  (1.702 m)  Wt 163 lb (73.936 kg)  BMI 25.53 kg/m2  Physical Exam  Physical Exam  Constitutional: She is oriented to person, place, and time and well-developed, well-nourished, and in no distress. No distress.  HENT:  Head: Normocephalic and atraumatic.  Right Ear: External ear normal.  Left Ear: External ear normal.  Nose: Nose normal.  Mouth/Throat: Oropharynx is clear and moist. No oropharyngeal exudate.  Eyes: Conjunctivae are normal. Pupils are equal, round, and reactive to light. Right eye exhibits no discharge. Left eye exhibits no discharge. No scleral icterus.  Neck: Normal range of motion. Neck supple. No thyromegaly present.  Cardiovascular: Normal rate, regular rhythm, normal heart sounds and intact distal pulses.   No murmur heard. Pulmonary/Chest: Effort normal and breath sounds normal. No respiratory distress. She has no wheezes. She has no rales.  Abdominal: Soft. Bowel sounds are normal. She exhibits no distension and no mass. There is no tenderness.  Musculoskeletal: Normal range of motion. She exhibits no edema and no tenderness.  Lymphadenopathy:    She has no cervical adenopathy.  Neurological: She is alert and oriented to person, place, and time. She has normal reflexes. No cranial nerve deficit. Coordination normal.  Skin: Skin is warm and dry. No rash noted. She is not diaphoretic.  Psychiatric: Mood, memory and affect normal.       Assessment & Plan  Tachycardia Well controlled today  OVERWEIGHT Continues to have persistent weight loss,  encouraged ongoing attempts at heart healthy eating andincreased exercise  Mixed hyperlipidemia Mild avoid trans fats, encouraged daily fatty acid supplement  GERD No c/o. Encouraged daily probiotics. Using Omeprazole, encouraged to titrate down dosing as tolerated  DEPRESSION Doing well off of medications at this time  Preventative health care Encouraged heart healthy diet and increased exercise, will see annually with lab work or as needed.

## 2011-11-23 NOTE — Assessment & Plan Note (Signed)
Well controlled today.

## 2011-11-23 NOTE — Assessment & Plan Note (Signed)
Doing well off of medications at this time

## 2011-11-23 NOTE — Assessment & Plan Note (Addendum)
No c/o. Encouraged daily probiotics. Using Omeprazole, encouraged to titrate down dosing as tolerated

## 2011-11-23 NOTE — Assessment & Plan Note (Signed)
Encouraged heart healthy diet and increased exercise, will see annually with lab work or as needed.

## 2011-11-23 NOTE — Assessment & Plan Note (Signed)
Mild avoid trans fats, encouraged daily fatty acid supplement

## 2011-11-23 NOTE — Assessment & Plan Note (Signed)
Continues to have persistent weight loss, encouraged ongoing attempts at heart healthy eating andincreased exercise

## 2012-07-23 LAB — HM PAP SMEAR: HM Pap smear: NORMAL

## 2012-09-27 ENCOUNTER — Other Ambulatory Visit: Payer: Self-pay | Admitting: Obstetrics and Gynecology

## 2012-09-27 DIAGNOSIS — Z1231 Encounter for screening mammogram for malignant neoplasm of breast: Secondary | ICD-10-CM

## 2012-10-28 ENCOUNTER — Ambulatory Visit: Payer: PRIVATE HEALTH INSURANCE

## 2012-11-02 ENCOUNTER — Ambulatory Visit
Admission: RE | Admit: 2012-11-02 | Discharge: 2012-11-02 | Disposition: A | Discharge status: Home / Self Care | Payer: PRIVATE HEALTH INSURANCE | Source: Ambulatory Visit | Attending: Obstetrics and Gynecology | Admitting: Obstetrics and Gynecology

## 2012-11-02 DIAGNOSIS — Z1231 Encounter for screening mammogram for malignant neoplasm of breast: Secondary | ICD-10-CM

## 2012-11-05 IMAGING — RF DG HYSTEROGRAM
8 series · 8 of 8 positions shown · IV contrast (omnipaque)
Comparison: none

CLINICAL DATA: Status post Essure micro-insert placement for
contraception.  Evaluate micro-insert location and tubal patency.

HYSTEROSALPINGOGRAM
TECHNIQUE: Following cleansing of the cervix and vagina with
Betadine solution, a hysterosalpingogram was performed using a 5-
French hysterosalpingogram catheter and Omnipaque 300 contrast. The
patient tolerated the examination without difficulty.
Fluoroscopy time: 1.4 minutes

[Series 1: run · 1 of 1 slices shown (1 of 8)]
[im 1/1]
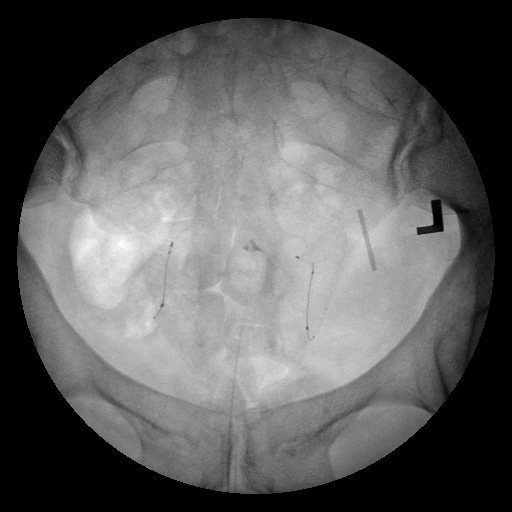

[Series 2: run · 1 of 1 slices shown (2 of 8)]
[im 1/1]
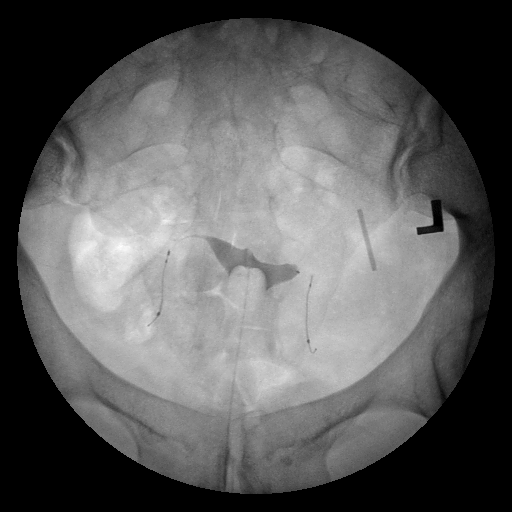

[Series 3: run · 1 of 1 slices shown (3 of 8)]
[im 1/1]
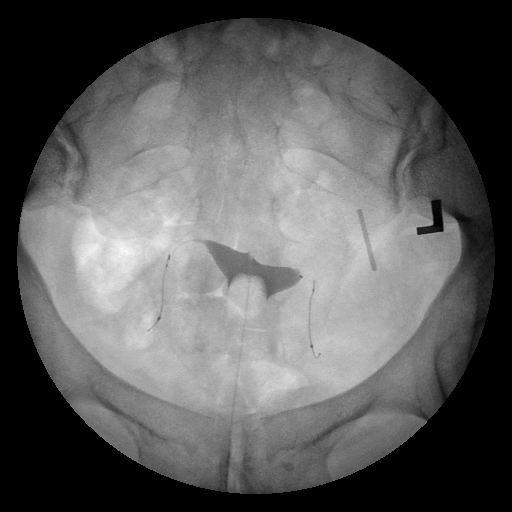

[Series 4: run · 1 of 1 slices shown (4 of 8)]
[im 1/1]
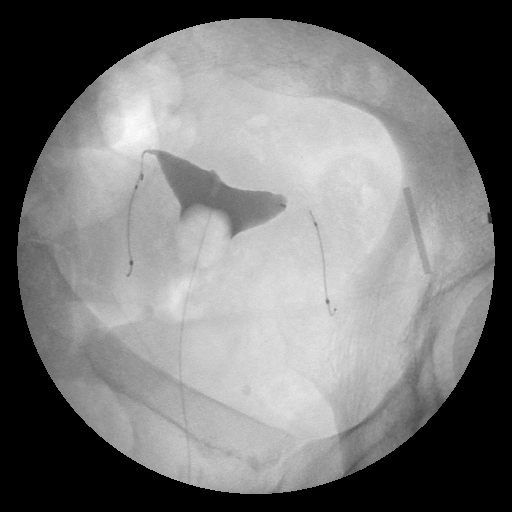

[Series 5: run · 1 of 1 slices shown (5 of 8)]
[im 1/1]
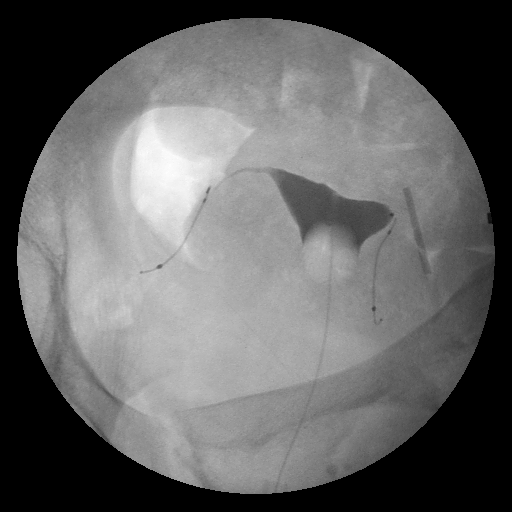

[Series 6: run · 1 of 1 slices shown (6 of 8)]
[im 1/1]
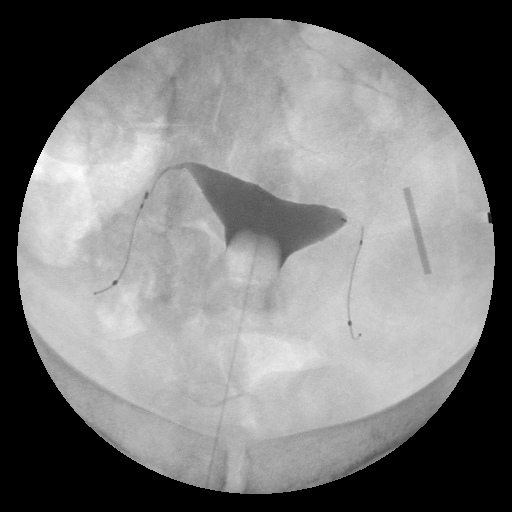

[Series 7: run · 1 of 1 slices shown (7 of 8)]
[im 1/1]
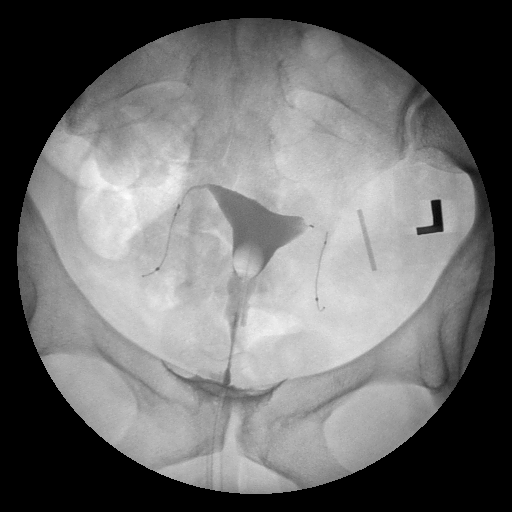

[Series 8: run · 1 of 1 slices shown (8 of 8)]
[im 1/1]
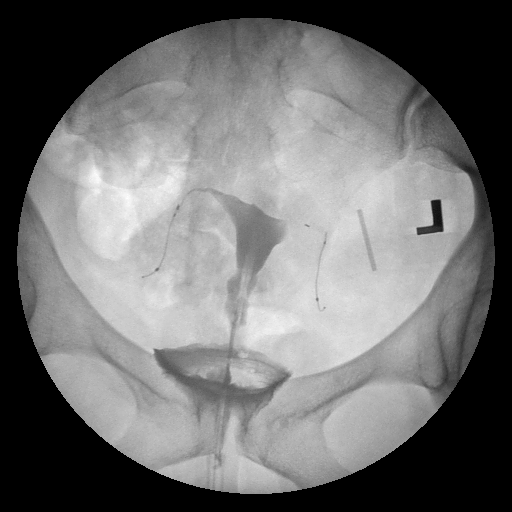

[8 of 8 positions shown; findings below may reference images not displayed]

FINDINGS: The Essure microinserts are seen in satisfactory
location within both of the fallopian tubes.

Contrast is seen entering the proximal most portion of the right
fallopian tube, but not passed any portion of the length of the
outer coil (category 2).

No contrast is seen opacifying the left fallopian tube, which is
occluded at the cornua (category 1).
IMPRESSION: 1.  Both Essure micro-inserts are in satisfactory location.
2.  The left fallopian tube is occluded at the cornua (category 1
tubal occlusion).
3.  Contrast is visible within the proximal right fallopian tube,
but not passed any portion of the length of the outer coil of the
micro-insert (category 2 tubal occlusion).

## 2012-11-06 ENCOUNTER — Other Ambulatory Visit: Payer: Self-pay

## 2012-11-22 ENCOUNTER — Encounter: Payer: PRIVATE HEALTH INSURANCE | Admitting: Family Medicine

## 2012-11-23 ENCOUNTER — Encounter: Payer: PRIVATE HEALTH INSURANCE | Admitting: Family Medicine

## 2012-12-21 ENCOUNTER — Ambulatory Visit (INDEPENDENT_AMBULATORY_CARE_PROVIDER_SITE_OTHER): Payer: PRIVATE HEALTH INSURANCE | Admitting: Family Medicine

## 2012-12-21 ENCOUNTER — Encounter: Payer: Self-pay | Admitting: Family Medicine

## 2012-12-21 ENCOUNTER — Other Ambulatory Visit: Payer: Self-pay | Admitting: Family Medicine

## 2012-12-21 VITALS — BP 122/82 | HR 76 | Temp 97.9°F | Ht 66.75 in | Wt 164.0 lb

## 2012-12-21 DIAGNOSIS — R000 Tachycardia, unspecified: Secondary | ICD-10-CM

## 2012-12-21 DIAGNOSIS — E785 Hyperlipidemia, unspecified: Secondary | ICD-10-CM

## 2012-12-21 DIAGNOSIS — E782 Mixed hyperlipidemia: Secondary | ICD-10-CM

## 2012-12-21 DIAGNOSIS — F329 Major depressive disorder, single episode, unspecified: Secondary | ICD-10-CM

## 2012-12-21 DIAGNOSIS — G43909 Migraine, unspecified, not intractable, without status migrainosus: Secondary | ICD-10-CM

## 2012-12-21 DIAGNOSIS — G8929 Other chronic pain: Secondary | ICD-10-CM

## 2012-12-21 DIAGNOSIS — M25539 Pain in unspecified wrist: Secondary | ICD-10-CM

## 2012-12-21 DIAGNOSIS — Z0000 Encounter for general adult medical examination without abnormal findings: Secondary | ICD-10-CM

## 2012-12-21 LAB — HEPATIC FUNCTION PANEL
AST: 16 U/L (ref 0–37)
Albumin: 3.9 g/dL (ref 3.5–5.2)
Alkaline Phosphatase: 48 U/L (ref 39–117)
Bilirubin, Direct: 0.1 mg/dL (ref 0.0–0.3)
Indirect Bilirubin: 0.5 mg/dL (ref 0.0–0.9)
Total Bilirubin: 0.6 mg/dL (ref 0.3–1.2)

## 2012-12-21 LAB — CBC
HCT: 44.1 % (ref 36.0–46.0)
MCHC: 33.8 g/dL (ref 30.0–36.0)
RDW: 13.4 % (ref 11.5–15.5)

## 2012-12-21 LAB — LIPID PANEL
HDL: 44 mg/dL (ref >39)
Total CHOL/HDL Ratio: 4.4 Ratio
VLDL: 21 mg/dL (ref 0–40)

## 2012-12-21 LAB — TSH: TSH: 2.478 u[IU]/mL (ref 0.350–4.500)

## 2012-12-21 LAB — PHOSPHORUS: Phosphorus: 2.7 mg/dL (ref 2.3–4.6)

## 2012-12-21 LAB — BASIC METABOLIC PANEL
BUN: 14 mg/dL (ref 6–23)
Calcium: 9 mg/dL (ref 8.4–10.5)
Glucose, Bld: 79 mg/dL (ref 70–99)
Sodium: 138 mEq/L (ref 135–145)

## 2012-12-21 MED ORDER — RIZATRIPTAN BENZOATE 10 MG PO TABS: 10.0000 mg | ORAL_TABLET | ORAL | Status: AC | PRN

## 2012-12-21 NOTE — Patient Instructions (Addendum)
MegaRed krill oil caps daily Digestive Advantage probiotics daily Aspercreme   Migraine Headache A migraine headache is an intense, throbbing pain on one or both sides of your head. A migraine can last for 30 minutes to several hours. CAUSES  The exact cause of a migraine headache is not always known. However, a migraine may be caused when nerves in the brain become irritated and release chemicals that cause inflammation. This causes pain. SYMPTOMS  Pain on one or both sides of your head.  Pulsating or throbbing pain.  Severe pain that prevents daily activities.  Pain that is aggravated by any physical activity.  Nausea, vomiting, or both.  Dizziness.  Pain with exposure to bright lights, loud noises, or activity.  General sensitivity to bright lights, loud noises, or smells. Before you get a migraine, you may get warning signs that a migraine is coming (aura). An aura may include:  Seeing flashing lights.  Seeing bright spots, halos, or zig-zag lines.  Having tunnel vision or blurred vision.  Having feelings of numbness or tingling.  Having trouble talking.  Having muscle weakness. MIGRAINE TRIGGERS  Alcohol.  Smoking.  Stress.  Menstruation.  Aged cheeses.  Foods or drinks that contain nitrates, glutamate, aspartame, or tyramine.  Lack of sleep.  Chocolate.  Caffeine.  Hunger.  Physical exertion.  Fatigue.  Medicines used to treat chest pain (nitroglycerine), birth control pills, estrogen, and some blood pressure medicines. DIAGNOSIS  A migraine headache is often diagnosed based on:  Symptoms.  Physical examination.  A CT scan or MRI of your head. TREATMENT Medicines may be given for pain and nausea. Medicines can also be given to help prevent recurrent migraines.  HOME CARE INSTRUCTIONS  Only take over-the-counter or prescription medicines for pain or discomfort as directed by your caregiver. The use of long-term narcotics is not  recommended.  Lie down in a dark, quiet room when you have a migraine.  Keep a journal to find out what may trigger your migraine headaches. For example, write down:  What you eat and drink.  How much sleep you get.  Any change to your diet or medicines.  Limit alcohol consumption.  Quit smoking if you smoke.  Get 7 to 9 hours of sleep, or as recommended by your caregiver.  Limit stress.  Keep lights dim if bright lights bother you and make your migraines worse. SEEK IMMEDIATE MEDICAL CARE IF:   Your migraine becomes severe.  You have a fever.  You have a stiff neck.  You have vision loss.  You have muscular weakness or loss of muscle control.  You start losing your balance or have trouble walking.  You feel faint or pass out.  You have severe symptoms that are different from your first symptoms. MAKE SURE YOU:   Understand these instructions.  Will watch your condition.  Will get help right away if you are not doing well or get worse. Document Released: 09/08/2005 Document Revised: 12/01/2011 Document Reviewed: 08/29/2011 [facility] Patient Information 2013 [place], [place].

## 2012-12-24 ENCOUNTER — Other Ambulatory Visit: Payer: Self-pay | Admitting: Family Medicine

## 2012-12-25 DIAGNOSIS — G8929 Other chronic pain: Secondary | ICD-10-CM | POA: Insufficient documentation

## 2012-12-25 NOTE — Assessment & Plan Note (Signed)
encouraged 8 hours of sleep. Needs heart healthy diet and small, frequent meals, adequate hydration

## 2012-12-25 NOTE — Assessment & Plan Note (Signed)
Well controlled today.

## 2012-12-25 NOTE — Assessment & Plan Note (Signed)
Doing well without medications at this time 

## 2012-12-25 NOTE — Assessment & Plan Note (Signed)
Mild, avoid trans fats, consider fatty acid supplements, continue regular exercise

## 2012-12-25 NOTE — Assessment & Plan Note (Signed)
Responds well to Maxalt. Continue same, encouraged regular exercise, meals and sleep.

## 2012-12-25 NOTE — Progress Notes (Signed)
Patient ID: Autumn Jensen, female   DOB: 26-Oct-1970, 42 y.o.   MRN: [id number] JULIE NAY [id number] Aug 07, 1971 12/25/2012      Progress Note New Patient  Subjective  Chief Complaint  Chief Complaint  Patient presents with  . Annual Exam    physical and labs    HPI  Patient is a 42 year old Caucasian female who is in today for annual exam. Generally she is doing well but she continues to struggle with knee pain secondary to previous injury. She also is noting some recurrent wrist pain forearm pain. Tends to be worse with increased use and driving. His never warm or red. No falls or recent injury. She does note some tingling is associated at times into her fingertips. She has been placed on OCPs by her gynecologist for her migraines and has been very helpful. Initial OCP constant nausea with her current one has been helpful. No other recent illness. No fevers, chills, chest pain, palpitations, shortness of breath, GI or GU concerns or otherwise noted today.  Past Medical History  Diagnosis Date  . GERD (gastroesophageal reflux disease)   . Headaches, cluster   . Overweight 09/30/2010  . Mixed hyperlipidemia 09/30/2010  . MIGRAINE HEADACHE 09/30/2010  . HEADACHE, TENSION 09/30/2010  . GERD 09/30/2010  . DEPRESSION 09/30/2010  . Contact dermatitis 02/11/2011  . Pharyngitis, acute 02/11/2011  . Tachycardia 02/11/2011  . Preventative health care 11/23/2011    Past Surgical History  Procedure Laterality Date  . Typanostomy tubes  42 yr old  . Right knee repair and cartilage repair  2010  . Left knee repair  1988    arthroscopic  . Liposuction  2001    thighs  . Nissan fundoplication for gerd  2005  . Arthroscopic repair acl      also had rt knee athroplasty with cartlidge removal  . Gastric fundoplication      Family History  Problem Relation Age of Onset  . Hyperlipidemia Mother   . Cancer Father     prostate  . Hyperlipidemia Father   . Heart disease Sister     congenital  .  Cancer Paternal Uncle     gastric  . Esophageal cancer Paternal Uncle   . Migraines Maternal Grandmother     chronic headaches  . Aneurysm Maternal Grandmother     brain  . Cancer Paternal Grandmother     lung  . Cancer Paternal Uncle     head and neck cancer in sinuses    History   Social History  . Marital Status: Married    Spouse Name: N/A    Number of Children: N/A  . Years of Education: N/A   Occupational History  . Not on file.   Social History Main Topics  . Smoking status: Never Smoker   . Smokeless tobacco: Never Used  . Alcohol Use: 0.6 oz/week    1 Glasses of wine, 0 Drinks containing 0.5 oz of alcohol per week     Comment: rare  . Drug Use: No  . Sexually Active: Yes   Other Topics Concern  . Not on file   Social History Narrative  . No narrative on file    Current Outpatient Prescriptions on File Prior to Visit  Medication Sig Dispense Refill  . multivitamin (THERAGRAN) per tablet Take 1 tablet by mouth daily.       . Omega 3-6-9 Fatty Acids (OMEGA-3 & OMEGA-6 FISH OIL PO) Take by mouth daily. 2 capsules  No current facility-administered medications on file prior to visit.    No Known Allergies  Review of Systems  Review of Systems  Constitutional: Positive for malaise/fatigue. Negative for fever and chills.  HENT: Negative for hearing loss, nosebleeds and congestion.   Eyes: Negative for discharge.  Respiratory: Negative for cough, sputum production, shortness of breath and wheezing.   Cardiovascular: Negative for chest pain, palpitations and leg swelling.  Gastrointestinal: Negative for heartburn, nausea, vomiting, abdominal pain, diarrhea, constipation and blood in stool.  Genitourinary: Negative for dysuria, urgency, frequency and hematuria.  Musculoskeletal: Positive for joint pain. Negative for myalgias, back pain and falls.  Skin: Negative for rash.  Neurological: Positive for headaches. Negative for dizziness, tremors, sensory  change, focal weakness, loss of consciousness and weakness.  Endo/Heme/Allergies: Negative for polydipsia. Does not bruise/bleed easily.  Psychiatric/Behavioral: Negative for depression and suicidal ideas. The patient is not nervous/anxious and does not have insomnia.     Objective  BP 122/82  Pulse 76  Temp(Src) 97.9 F (36.6 C) (Oral)  Ht 5' 6.75" (1.695 m)  Wt 164 lb (74.39 kg)  BMI 25.89 kg/m2  SpO2 99%  Physical Exam  Physical Exam  Constitutional: She is oriented to person, place, and time and well-developed, well-nourished, and in no distress. No distress.  HENT:  Head: Normocephalic and atraumatic.  Right Ear: External ear normal.  Left Ear: External ear normal.  Nose: Nose normal.  Mouth/Throat: Oropharynx is clear and moist. No oropharyngeal exudate.  Eyes: Conjunctivae are normal. Pupils are equal, round, and reactive to light. Right eye exhibits no discharge. Left eye exhibits no discharge. No scleral icterus.  Neck: Normal range of motion. Neck supple. No thyromegaly present.  Cardiovascular: Normal rate, regular rhythm, normal heart sounds and intact distal pulses.   No murmur heard. Pulmonary/Chest: Effort normal and breath sounds normal. No respiratory distress. She has no wheezes. She has no rales.  Abdominal: Soft. Bowel sounds are normal. She exhibits no distension and no mass. There is no tenderness.  Musculoskeletal: Normal range of motion. She exhibits no edema and no tenderness.  Lymphadenopathy:    She has no cervical adenopathy.  Neurological: She is alert and oriented to person, place, and time. She has normal reflexes. No cranial nerve deficit. Coordination normal.  Skin: Skin is warm and dry. No rash noted. She is not diaphoretic.  Psychiatric: Mood, memory and affect normal.       Assessment & Plan  DEPRESSION Doing well without medications at this time.  Tachycardia Well controlled today  Mixed hyperlipidemia Mild, avoid trans fats,  consider fatty acid supplements, continue regular exercise  MIGRAINE HEADACHE Responds well to Maxalt. Continue same, encouraged regular exercise, meals and sleep.   Preventative health care encouraged 8 hours of sleep. Needs heart healthy diet and small, frequent meals, adequate hydration  Chronic wrist pain Likely over use injury, try wrist splints qhs and prn, ice and Aspercreme

## 2012-12-25 NOTE — Assessment & Plan Note (Signed)
Likely over use injury, try wrist splints qhs and prn, ice and Aspercreme

## 2013-07-28 ENCOUNTER — Other Ambulatory Visit: Payer: Self-pay

## 2013-12-22 ENCOUNTER — Encounter: Payer: PRIVATE HEALTH INSURANCE | Admitting: Family Medicine

## 2014-02-09 ENCOUNTER — Ambulatory Visit (INDEPENDENT_AMBULATORY_CARE_PROVIDER_SITE_OTHER): Payer: PRIVATE HEALTH INSURANCE | Admitting: Family Medicine

## 2014-02-09 ENCOUNTER — Encounter: Payer: Self-pay | Admitting: Family Medicine

## 2014-02-09 VITALS — BP 138/85 | HR 80 | Temp 98.0°F | Resp 18 | Ht 66.75 in | Wt 165.0 lb

## 2014-02-09 DIAGNOSIS — E785 Hyperlipidemia, unspecified: Secondary | ICD-10-CM

## 2014-02-09 DIAGNOSIS — E663 Overweight: Secondary | ICD-10-CM

## 2014-02-09 DIAGNOSIS — F419 Anxiety disorder, unspecified: Secondary | ICD-10-CM

## 2014-02-09 DIAGNOSIS — F3289 Other specified depressive episodes: Secondary | ICD-10-CM

## 2014-02-09 DIAGNOSIS — D72829 Elevated white blood cell count, unspecified: Secondary | ICD-10-CM

## 2014-02-09 DIAGNOSIS — E782 Mixed hyperlipidemia: Secondary | ICD-10-CM

## 2014-02-09 DIAGNOSIS — Z0000 Encounter for general adult medical examination without abnormal findings: Secondary | ICD-10-CM

## 2014-02-09 DIAGNOSIS — R000 Tachycardia, unspecified: Secondary | ICD-10-CM

## 2014-02-09 DIAGNOSIS — F329 Major depressive disorder, single episode, unspecified: Secondary | ICD-10-CM

## 2014-02-09 LAB — HEPATIC FUNCTION PANEL
ALK PHOS: 50 U/L (ref 39–117)
ALT: 14 U/L (ref 0–35)
AST: 19 U/L (ref 0–37)
Albumin: 3.8 g/dL (ref 3.5–5.2)
BILIRUBIN DIRECT: 0.1 mg/dL (ref 0.0–0.3)
BILIRUBIN INDIRECT: 0.4 mg/dL (ref 0.2–1.2)
BILIRUBIN TOTAL: 0.5 mg/dL (ref 0.2–1.2)
Total Protein: 6.9 g/dL (ref 6.0–8.3)

## 2014-02-09 LAB — RENAL FUNCTION PANEL
Albumin: 3.8 g/dL (ref 3.5–5.2)
BUN: 15 mg/dL (ref 6–23)
CO2: 26 meq/L (ref 19–32)
Calcium: 9.1 mg/dL (ref 8.4–10.5)
Chloride: 100 meq/L (ref 96–112)
Creat: 0.98 mg/dL (ref 0.50–1.10)
Glucose, Bld: 78 mg/dL (ref 70–99)
Phosphorus: 2.5 mg/dL (ref 2.3–4.6)
Potassium: 4.9 meq/L (ref 3.5–5.3)
Sodium: 135 meq/L (ref 135–145)

## 2014-02-09 LAB — LIPID PANEL
Cholesterol: 195 mg/dL (ref 0–200)
HDL: 58 mg/dL
LDL Cholesterol: 115 mg/dL — ABNORMAL HIGH (ref 0–99)
Total CHOL/HDL Ratio: 3.4 ratio
Triglycerides: 110 mg/dL
VLDL: 22 mg/dL (ref 0–40)

## 2014-02-09 LAB — CBC
HEMATOCRIT: 40.2 % (ref 36.0–46.0)
Hemoglobin: 13.7 g/dL (ref 12.0–15.0)
MCH: 29.3 pg (ref 26.0–34.0)
MCHC: 34.1 g/dL (ref 30.0–36.0)
MCV: 85.9 fL (ref 78.0–100.0)
Platelets: 308 10*3/uL (ref 150–400)
RBC: 4.68 MIL/uL (ref 3.87–5.11)
RDW: 13 % (ref 11.5–15.5)
WBC: 11 10*3/uL — ABNORMAL HIGH (ref 4.0–10.5)

## 2014-02-09 LAB — TSH: TSH: 1.975 u[IU]/mL (ref 0.350–4.500)

## 2014-02-09 MED ORDER — SERTRALINE HCL 50 MG PO TABS: 50.0000 mg | ORAL_TABLET | Freq: Every day | ORAL | Status: AC

## 2014-02-09 NOTE — Progress Notes (Signed)
Patient ID: Autumn Jensen, female   DOB: June 28, 1971, 43 y.o.   MRN: [id number] SHAVONNA CORELLA [id number] 1971/09/16 02/09/2014      Progress Note-Follow Up  Subjective  Chief Complaint  Chief Complaint  Patient presents with  . Annual Exam    no pap smear    HPI  Patient is a 43 year old female in today for routine medical care. She is here for annual exam just before moving to make into a dry. Her husband has gotten a new job. She has been struggling with depression and is doing better so is considering stopping her sertraline. Has dropped to 25 mg daily without any difficulty. No significant anhedonia. No suicidal ideation. Otherwise feels fairly well. Hypertension well controlled. No recent illness. No recent migraine or tension headaches and she technologist bad improved with the initiation of sertraline. Denies CP/palp/SOB/HA/congestion/fevers/GI or GU c/o. Taking meds as prescribed  Past Medical History  Diagnosis Date  . GERD (gastroesophageal reflux disease)   . Headaches, cluster   . Overweight 09/30/2010  . Mixed hyperlipidemia 09/30/2010  . MIGRAINE HEADACHE 09/30/2010  . HEADACHE, TENSION 09/30/2010  . GERD 09/30/2010  . DEPRESSION 09/30/2010  . Contact dermatitis 02/11/2011  . Pharyngitis, acute 02/11/2011  . Tachycardia 02/11/2011  . Preventative health care 11/23/2011    Past Surgical History  Procedure Laterality Date  . Typanostomy tubes  43 yr old  . Right knee repair and cartilage repair  2010  . Left knee repair  1988    arthroscopic  . Liposuction  2001    thighs  . Nissan fundoplication for gerd  6962  . Arthroscopic repair acl      also had rt knee athroplasty with cartlidge removal  . Gastric fundoplication      Family History  Problem Relation Age of Onset  . Hyperlipidemia Mother   . Cancer Father     prostate  . Hyperlipidemia Father   . Heart disease Sister     congenital  . Cancer Paternal Uncle     gastric  . Esophageal cancer Paternal Uncle    . Migraines Maternal Grandmother     chronic headaches  . Aneurysm Maternal Grandmother     brain  . Cancer Paternal Grandmother     lung  . Cancer Paternal Uncle     head and neck cancer in sinuses    History   Social History  . Marital Status: Married    Spouse Name: N/A    Number of Children: N/A  . Years of Education: N/A   Occupational History  . Not on file.   Social History Main Topics  . Smoking status: Never Smoker   . Smokeless tobacco: Never Used  . Alcohol Use: 0.6 oz/week    1 Glasses of wine, 0 Drinks containing 0.5 oz of alcohol per week     Comment: rare  . Drug Use: No  . Sexual Activity: Yes   Other Topics Concern  . Not on file   Social History Narrative  . No narrative on file    Current Outpatient Prescriptions on File Prior to Visit  Medication Sig Dispense Refill  . multivitamin (THERAGRAN) per tablet Take 1 tablet by mouth daily.       . Omega 3-6-9 Fatty Acids (OMEGA-3 & OMEGA-6 FISH OIL PO) Take by mouth daily. 2 capsules       . omeprazole (PRILOSEC) 20 MG capsule TAKE ONE CAPSULE BY MOUTH EVERY DAY  90 capsule  3  . rizatriptan (MAXALT) 10 MG tablet Take 1 tablet (10 mg total) by mouth as needed for migraine. May repeat in 2 hours if needed  10 tablet  1   No current facility-administered medications on file prior to visit.    No Known Allergies  Review of Systems  Review of Systems  Constitutional: Negative for fever and malaise/fatigue.  HENT: Negative for congestion.   Eyes: Negative for discharge.  Respiratory: Negative for shortness of breath.   Cardiovascular: Negative for chest pain, palpitations and leg swelling.  Gastrointestinal: Negative for heartburn, nausea, abdominal pain and diarrhea.  Genitourinary: Negative for dysuria.  Musculoskeletal: Negative for falls.  Skin: Negative for rash.  Neurological: Negative for loss of consciousness and headaches.  Endo/Heme/Allergies: Negative for polydipsia.   Psychiatric/Behavioral: Negative for depression and suicidal ideas. The patient is not nervous/anxious and does not have insomnia.     Objective  BP 138/85  Pulse 80  Temp(Src) 98 F (36.7 C) (Oral)  Resp 18  Ht 5' 6.75" (1.695 m)  Wt 165 lb (74.844 kg)  BMI 26.05 kg/m2  SpO2 99%  LMP 01/26/2014  Physical Exam  Physical Exam  Constitutional: She is oriented to person, place, and time and well-developed, well-nourished, and in no distress. No distress.  HENT:  Head: Normocephalic and atraumatic.  Right Ear: External ear normal.  Left Ear: External ear normal.  Nose: Nose normal.  Mouth/Throat: Oropharynx is clear and moist. No oropharyngeal exudate.  Eyes: Conjunctivae are normal. Pupils are equal, round, and reactive to light. Right eye exhibits no discharge. Left eye exhibits no discharge. No scleral icterus.  Neck: Normal range of motion. Neck supple. No thyromegaly present.  Cardiovascular: Normal rate, regular rhythm, normal heart sounds and intact distal pulses.   No murmur heard. Pulmonary/Chest: Effort normal and breath sounds normal. No respiratory distress. She has no wheezes. She has no rales.  Abdominal: Soft. Bowel sounds are normal. She exhibits no distension and no mass. There is no tenderness.  Musculoskeletal: Normal range of motion. She exhibits no edema and no tenderness.  Lymphadenopathy:    She has no cervical adenopathy.  Neurological: She is alert and oriented to person, place, and time. She has normal reflexes. No cranial nerve deficit. Coordination normal.  Skin: Skin is warm and dry. No rash noted. She is not diaphoretic.  Psychiatric: Mood, memory and affect normal.    Lab Results  Component Value Date   TSH 2.478 12/21/2012   Lab Results  Component Value Date   WBC 9.4 12/21/2012   HGB 14.9 12/21/2012   HCT 44.1 12/21/2012   MCV 85.6 12/21/2012   PLT 329 12/21/2012   Lab Results  Component Value Date   CREATININE 0.96 12/21/2012   BUN 14 12/21/2012    NA 138 12/21/2012   K 4.7 12/21/2012   CL 105 12/21/2012   CO2 25 12/21/2012   Lab Results  Component Value Date   ALT 14 12/21/2012   AST 16 12/21/2012   ALKPHOS 48 12/21/2012   BILITOT 0.6 12/21/2012   Lab Results  Component Value Date   CHOL 193 12/21/2012   Lab Results  Component Value Date   HDL 44 12/21/2012   Lab Results  Component Value Date   LDLCALC 128* 12/21/2012   Lab Results  Component Value Date   TRIG 105 12/21/2012   Lab Results  Component Value Date   CHOLHDL 4.4 12/21/2012     Assessment & Plan    Tachycardia RRR  today  Preventative health care Patient encouraged to maintain heart healthy diet, regular exercise, adequate sleep. Consider daily probiotics. Take medications as prescribed  Mixed hyperlipidemia Mild, Encouraged heart healthy diet, increase exercise, avoid trans fats, consider a krill oil cap daily  OVERWEIGHT Encouraged DASH diet, decrease po intake and increase exercise as tolerated. Needs 7-8 hours of sleep nightly. Avoid trans fats, eat small, frequent meals every 4-5 hours with lean proteins, complex carbs and healthy fats. Minimize simple carbs, GMO foods.  DEPRESSION Is interested in coming off of the Sertraline and has titrated down to 25 mg daily and is doing OK, is given a refill on the 50 mg in case she changes her mind with the move to [place]. Can find a PMD there  Leukocytosis Very slight, asymptomatic. Likely subclinical illness but patient advised to follow up with labs with new PMD in roughly 6 months to assess resolution

## 2014-02-09 NOTE — Patient Instructions (Signed)
DASH Diet  The DASH diet stands for "Dietary Approaches to Stop Hypertension." It is a healthy eating plan that has been shown to reduce high blood pressure (hypertension) in as little as 14 days, while also possibly providing other significant health benefits. These other health benefits include reducing the risk of breast cancer after menopause and reducing the risk of type 2 diabetes, heart disease, colon cancer, and stroke. Health benefits also include weight loss and slowing kidney failure in patients with chronic kidney disease.   DIET GUIDELINES  · Limit salt (sodium). Your diet should contain less than 1500 mg of sodium daily.  · Limit refined or processed carbohydrates. Your diet should include mostly whole grains. Desserts and added sugars should be used sparingly.  · Include small amounts of heart-healthy fats. These types of fats include nuts, oils, and tub margarine. Limit saturated and trans fats. These fats have been shown to be harmful in the body.  CHOOSING FOODS   The following food groups are based on a 2000 calorie diet. See your Registered Dietitian for individual calorie needs.  Grains and Grain Products (6 to 8 servings daily)  · Eat More Often: Whole-wheat bread, brown rice, whole-grain or wheat pasta, quinoa, popcorn without added fat or salt (air popped).  · Eat Less Often: White bread, white pasta, white rice, cornbread.  Vegetables (4 to 5 servings daily)  · Eat More Often: Fresh, frozen, and canned vegetables. Vegetables may be raw, steamed, roasted, or grilled with a minimal amount of fat.  · Eat Less Often/Avoid: Creamed or fried vegetables. Vegetables in a cheese sauce.  Fruit (4 to 5 servings daily)  · Eat More Often: All fresh, canned (in natural juice), or frozen fruits. Dried fruits without added sugar. One hundred percent fruit juice (½ cup [237 mL] daily).  · Eat Less Often: Dried fruits with added sugar. Canned fruit in light or heavy syrup.  Lean Meats, Fish, and Poultry (2  servings or less daily. One serving is 3 to 4 oz [85-114 g]).  · Eat More Often: Ninety percent or leaner ground beef, tenderloin, sirloin. Round cuts of beef, chicken breast, turkey breast. All fish. Grill, bake, or broil your meat. Nothing should be fried.  · Eat Less Often/Avoid: Fatty cuts of meat, turkey, or chicken leg, thigh, or wing. Fried cuts of meat or fish.  Dairy (2 to 3 servings)  · Eat More Often: Low-fat or fat-free milk, low-fat plain or light yogurt, reduced-fat or part-skim cheese.  · Eat Less Often/Avoid: Milk (whole, 2%). Whole milk yogurt. Full-fat cheeses.  Nuts, Seeds, and Legumes (4 to 5 servings per week)  · Eat More Often: All without added salt.  · Eat Less Often/Avoid: Salted nuts and seeds, canned beans with added salt.  Fats and Sweets (limited)  · Eat More Often: Vegetable oils, tub margarines without trans fats, sugar-free gelatin. Mayonnaise and salad dressings.  · Eat Less Often/Avoid: Coconut oils, palm oils, butter, stick margarine, cream, half and half, cookies, candy, pie.  FOR MORE INFORMATION  The Dash Diet Eating Plan: www.dashdiet.org  Document Released: 08/28/2011 Document Revised: 12/01/2011 Document Reviewed: 08/28/2011  [employer]® Patient Information ©2014 [employer].

## 2014-02-09 NOTE — Progress Notes (Signed)
Pre visit review using our clinic review tool, if applicable. No additional management support is needed unless otherwise documented below in the visit note. 

## 2014-02-13 ENCOUNTER — Encounter: Payer: Self-pay | Admitting: Family Medicine

## 2014-02-13 DIAGNOSIS — D72829 Elevated white blood cell count, unspecified: Secondary | ICD-10-CM | POA: Insufficient documentation

## 2014-02-13 HISTORY — DX: Elevated white blood cell count, unspecified: D72.829

## 2014-02-13 NOTE — Assessment & Plan Note (Signed)
Patient encouraged to maintain heart healthy diet, regular exercise, adequate sleep. Consider daily probiotics. Take medications as prescribed 

## 2014-02-13 NOTE — Assessment & Plan Note (Signed)
Very slight, asymptomatic. Likely subclinical illness but patient advised to follow up with labs with new PMD in roughly 6 months to assess resolution

## 2014-02-13 NOTE — Assessment & Plan Note (Signed)
RRR today 

## 2014-02-13 NOTE — Assessment & Plan Note (Signed)
Encouraged DASH diet, decrease po intake and increase exercise as tolerated. Needs 7-8 hours of sleep nightly. Avoid trans fats, eat small, frequent meals every 4-5 hours with lean proteins, complex carbs and healthy fats. Minimize simple carbs, GMO foods. 

## 2014-02-13 NOTE — Assessment & Plan Note (Signed)
Is interested in coming off of the Sertraline and has titrated down to 25 mg daily and is doing OK, is given a refill on the 50 mg in case she changes her mind with the move to [place]. Can find a PMD there

## 2014-02-13 NOTE — Assessment & Plan Note (Signed)
Mild, Encouraged heart healthy diet, increase exercise, avoid trans fats, consider a krill oil cap daily
